# Patient Record
Sex: Male | Born: 1960 | Race: Black or African American | Hispanic: No | Marital: Single | Smoking: Former smoker
Health system: Southern US, Community
[De-identification: ages and names within clinical notes are randomized; demographics above are authoritative.]

## PROBLEM LIST (undated history)

## (undated) DIAGNOSIS — B20 Human immunodeficiency virus [HIV] disease: Secondary | ICD-10-CM

## (undated) HISTORY — DX: Human immunodeficiency virus (HIV) disease: B20

---

## 2005-11-19 ENCOUNTER — Encounter: Admission: RE | Admit: 2005-11-19 | Discharge: 2005-11-19 | Payer: Self-pay | Admitting: Infectious Diseases

## 2005-11-19 ENCOUNTER — Encounter (INDEPENDENT_AMBULATORY_CARE_PROVIDER_SITE_OTHER): Payer: Self-pay | Admitting: *Deleted

## 2005-11-19 ENCOUNTER — Ambulatory Visit: Payer: Self-pay | Admitting: Internal Medicine

## 2005-11-19 LAB — CONVERTED CEMR LAB: CD4 T Cell Abs: 9

## 2005-12-04 ENCOUNTER — Ambulatory Visit: Payer: Self-pay | Admitting: Internal Medicine

## 2005-12-04 ENCOUNTER — Encounter (INDEPENDENT_AMBULATORY_CARE_PROVIDER_SITE_OTHER): Payer: Self-pay | Admitting: *Deleted

## 2005-12-11 ENCOUNTER — Ambulatory Visit (HOSPITAL_COMMUNITY): Admission: RE | Admit: 2005-12-11 | Discharge: 2005-12-11 | Payer: Self-pay | Admitting: Oral Surgery

## 2005-12-17 ENCOUNTER — Ambulatory Visit: Payer: Self-pay | Admitting: Internal Medicine

## 2005-12-25 ENCOUNTER — Ambulatory Visit: Payer: Self-pay | Admitting: Internal Medicine

## 2005-12-31 ENCOUNTER — Ambulatory Visit: Payer: Self-pay | Admitting: Internal Medicine

## 2005-12-31 ENCOUNTER — Ambulatory Visit (HOSPITAL_COMMUNITY): Admission: RE | Admit: 2005-12-31 | Discharge: 2005-12-31 | Payer: Self-pay | Admitting: Internal Medicine

## 2006-01-08 ENCOUNTER — Ambulatory Visit: Payer: Self-pay | Admitting: Internal Medicine

## 2006-01-10 ENCOUNTER — Ambulatory Visit (HOSPITAL_COMMUNITY): Admission: RE | Admit: 2006-01-10 | Discharge: 2006-01-10 | Payer: Self-pay | Admitting: Internal Medicine

## 2006-01-29 ENCOUNTER — Encounter: Admission: RE | Admit: 2006-01-29 | Discharge: 2006-01-29 | Payer: Self-pay | Admitting: Internal Medicine

## 2006-01-29 ENCOUNTER — Ambulatory Visit: Payer: Self-pay | Admitting: Internal Medicine

## 2006-01-29 ENCOUNTER — Encounter (INDEPENDENT_AMBULATORY_CARE_PROVIDER_SITE_OTHER): Payer: Self-pay | Admitting: *Deleted

## 2006-01-29 LAB — CONVERTED CEMR LAB: HIV 1 RNA Quant: 22200 copies/mL

## 2006-02-12 ENCOUNTER — Ambulatory Visit: Payer: Self-pay | Admitting: Internal Medicine

## 2006-03-07 DIAGNOSIS — B370 Candidal stomatitis: Secondary | ICD-10-CM

## 2006-03-07 DIAGNOSIS — K029 Dental caries, unspecified: Secondary | ICD-10-CM | POA: Insufficient documentation

## 2006-03-07 DIAGNOSIS — B20 Human immunodeficiency virus [HIV] disease: Secondary | ICD-10-CM

## 2006-03-07 DIAGNOSIS — F329 Major depressive disorder, single episode, unspecified: Secondary | ICD-10-CM

## 2006-03-07 DIAGNOSIS — B3781 Candidal esophagitis: Secondary | ICD-10-CM

## 2006-04-24 ENCOUNTER — Encounter: Payer: Self-pay | Admitting: Internal Medicine

## 2006-05-15 ENCOUNTER — Encounter (INDEPENDENT_AMBULATORY_CARE_PROVIDER_SITE_OTHER): Payer: Self-pay | Admitting: *Deleted

## 2006-05-15 ENCOUNTER — Encounter: Admission: RE | Admit: 2006-05-15 | Discharge: 2006-05-15 | Payer: Self-pay | Admitting: Internal Medicine

## 2006-05-15 ENCOUNTER — Ambulatory Visit: Payer: Self-pay | Admitting: Internal Medicine

## 2006-05-15 LAB — CONVERTED CEMR LAB: CD4 Count: 120 microliters

## 2006-05-28 ENCOUNTER — Ambulatory Visit: Payer: Self-pay | Admitting: Internal Medicine

## 2006-07-14 ENCOUNTER — Encounter (INDEPENDENT_AMBULATORY_CARE_PROVIDER_SITE_OTHER): Payer: Self-pay | Admitting: *Deleted

## 2006-07-14 LAB — CONVERTED CEMR LAB

## 2006-07-27 ENCOUNTER — Encounter (INDEPENDENT_AMBULATORY_CARE_PROVIDER_SITE_OTHER): Payer: Self-pay | Admitting: *Deleted

## 2006-09-02 ENCOUNTER — Encounter: Admission: RE | Admit: 2006-09-02 | Discharge: 2006-09-02 | Payer: Self-pay | Admitting: Internal Medicine

## 2006-09-02 ENCOUNTER — Ambulatory Visit: Payer: Self-pay | Admitting: Internal Medicine

## 2006-09-02 LAB — CONVERTED CEMR LAB
ALT: 14 units/L (ref 0–53)
Albumin: 4.2 g/dL (ref 3.5–5.2)
BUN: 13 mg/dL (ref 6–23)
Basophils Absolute: 0 10*3/uL (ref 0.0–0.1)
Basophils Relative: 1 % (ref 0–1)
CD4 Count: 200 microliters
Creatinine, Ser: 1.43 mg/dL (ref 0.40–1.50)
Eosinophils Relative: 8 % — ABNORMAL HIGH (ref 0–5)
HIV 1 RNA Quant: 67 copies/mL — ABNORMAL HIGH (ref <50)
HIV-1 RNA Quant, Log: 1.83 — ABNORMAL HIGH (ref <1.70)
MCV: 103.3 fL — ABNORMAL HIGH (ref 78.0–100.0)
Monocytes Absolute: 0.5 10*3/uL (ref 0.2–0.7)
Neutro Abs: 1.3 10*3/uL — ABNORMAL LOW (ref 1.7–7.7)
Neutrophils Relative %: 35 % — ABNORMAL LOW (ref 43–77)
Potassium: 4.7 meq/L (ref 3.5–5.3)
RDW: 13.1 % (ref 11.5–14.0)
Sodium: 137 meq/L (ref 135–145)
Total Protein: 7.5 g/dL (ref 6.0–8.3)

## 2006-09-17 ENCOUNTER — Ambulatory Visit: Payer: Self-pay | Admitting: Internal Medicine

## 2006-10-21 ENCOUNTER — Telehealth: Payer: Self-pay | Admitting: Internal Medicine

## 2006-11-17 ENCOUNTER — Encounter: Admission: RE | Admit: 2006-11-17 | Discharge: 2006-11-17 | Payer: Self-pay | Admitting: Internal Medicine

## 2006-11-17 ENCOUNTER — Ambulatory Visit: Payer: Self-pay | Admitting: Internal Medicine

## 2006-11-17 LAB — CONVERTED CEMR LAB
ALT: 10 units/L (ref 0–53)
Albumin: 4.2 g/dL (ref 3.5–5.2)
Basophils Absolute: 0.1 10*3/uL (ref 0.0–0.1)
CO2: 22 meq/L (ref 19–32)
Calcium: 10 mg/dL (ref 8.4–10.5)
Creatinine, Ser: 1.76 mg/dL — ABNORMAL HIGH (ref 0.40–1.50)
Eosinophils Absolute: 0.4 10*3/uL (ref 0.0–0.7)
Lymphs Abs: 1.4 10*3/uL (ref 0.7–3.3)
MCHC: 33 g/dL (ref 30.0–36.0)
MCV: 104.4 fL — ABNORMAL HIGH (ref 78.0–100.0)
Monocytes Absolute: 0.4 10*3/uL (ref 0.2–0.7)
Platelets: 151 10*3/uL (ref 150–400)
Potassium: 4.2 meq/L (ref 3.5–5.3)
Sodium: 137 meq/L (ref 135–145)
Total Protein: 7.3 g/dL (ref 6.0–8.3)

## 2006-11-18 ENCOUNTER — Encounter: Payer: Self-pay | Admitting: Internal Medicine

## 2006-11-20 ENCOUNTER — Ambulatory Visit: Payer: Self-pay | Admitting: Internal Medicine

## 2006-11-20 LAB — CONVERTED CEMR LAB
HIV 1 RNA Quant: 117 copies/mL — ABNORMAL HIGH (ref <50)
HIV-1 RNA Quant, Log: 2.07 — ABNORMAL HIGH (ref <1.70)

## 2006-11-24 ENCOUNTER — Telehealth: Payer: Self-pay | Admitting: Internal Medicine

## 2006-12-02 ENCOUNTER — Encounter (INDEPENDENT_AMBULATORY_CARE_PROVIDER_SITE_OTHER): Payer: Self-pay | Admitting: *Deleted

## 2006-12-05 ENCOUNTER — Ambulatory Visit: Payer: Self-pay | Admitting: Internal Medicine

## 2006-12-05 DIAGNOSIS — K219 Gastro-esophageal reflux disease without esophagitis: Secondary | ICD-10-CM | POA: Insufficient documentation

## 2007-02-23 ENCOUNTER — Ambulatory Visit: Payer: Self-pay | Admitting: Internal Medicine

## 2007-02-23 ENCOUNTER — Encounter: Admission: RE | Admit: 2007-02-23 | Discharge: 2007-02-23 | Payer: Self-pay | Admitting: Internal Medicine

## 2007-02-23 LAB — CONVERTED CEMR LAB
ALT: 10 units/L (ref 0–53)
Albumin: 4.2 g/dL (ref 3.5–5.2)
Alkaline Phosphatase: 161 units/L — ABNORMAL HIGH (ref 39–117)
Basophils Absolute: 0 10*3/uL (ref 0.0–0.1)
CO2: 23 meq/L (ref 19–32)
Calcium: 9.9 mg/dL (ref 8.4–10.5)
Chloride: 104 meq/L (ref 96–112)
Eosinophils Absolute: 0.3 10*3/uL (ref 0.0–0.7)
Eosinophils Relative: 6 % — ABNORMAL HIGH (ref 0–5)
HIV 1 RNA Quant: 79 copies/mL — ABNORMAL HIGH (ref <50)
Lymphocytes Relative: 36 % (ref 12–46)
MCHC: 33.4 g/dL (ref 30.0–36.0)
MCV: 103.2 fL — ABNORMAL HIGH (ref 78.0–100.0)
Monocytes Absolute: 0.5 10*3/uL (ref 0.2–0.7)
Monocytes Relative: 11 % (ref 3–11)
Platelets: 139 10*3/uL — ABNORMAL LOW (ref 150–400)
Sodium: 137 meq/L (ref 135–145)
Total Bilirubin: 2 mg/dL — ABNORMAL HIGH (ref 0.3–1.2)
Total Protein: 7.8 g/dL (ref 6.0–8.3)
WBC: 4.3 10*3/uL (ref 4.0–10.5)

## 2007-03-11 ENCOUNTER — Ambulatory Visit: Payer: Self-pay | Admitting: Internal Medicine

## 2007-03-12 ENCOUNTER — Encounter: Payer: Self-pay | Admitting: Internal Medicine

## 2007-06-12 ENCOUNTER — Ambulatory Visit: Payer: Self-pay | Admitting: Internal Medicine

## 2007-06-12 ENCOUNTER — Encounter: Admission: RE | Admit: 2007-06-12 | Discharge: 2007-06-12 | Payer: Self-pay | Admitting: Internal Medicine

## 2007-06-12 LAB — CONVERTED CEMR LAB
AST: 21 units/L (ref 0–37)
Albumin: 4.3 g/dL (ref 3.5–5.2)
Alkaline Phosphatase: 148 units/L — ABNORMAL HIGH (ref 39–117)
BUN: 11 mg/dL (ref 6–23)
Calcium: 9.5 mg/dL (ref 8.4–10.5)
Eosinophils Absolute: 0.3 10*3/uL (ref 0.0–0.7)
Eosinophils Relative: 6 % — ABNORMAL HIGH (ref 0–5)
Glucose, Bld: 111 mg/dL — ABNORMAL HIGH (ref 70–99)
HIV-1 RNA Quant, Log: <1.7 (ref <1.70)
MCHC: 33.8 g/dL (ref 30.0–36.0)
Monocytes Relative: 10 % (ref 3–12)
Neutrophils Relative %: 43 % (ref 43–77)
Platelets: 141 10*3/uL — ABNORMAL LOW (ref 150–400)
RBC: 4.91 M/uL (ref 4.22–5.81)
RDW: 14.2 % (ref 11.5–15.5)
Sodium: 141 meq/L (ref 135–145)
Total Bilirubin: 2.4 mg/dL — ABNORMAL HIGH (ref 0.3–1.2)

## 2007-06-24 ENCOUNTER — Ambulatory Visit: Payer: Self-pay | Admitting: Internal Medicine

## 2007-08-19 IMAGING — CT CT MAXILLOFACIAL W/ CM
1 of 2 series · 15 of 30 positions shown, 19 images · IV contrast (100 ML OMNI 300)
Comparison: none

CLINICAL DATA: 44-year-old male.  Evaluate sinuses for fungus disease.  History of surgery.
MAXILLOFACIAL CT WITH CONTRAST:
TECHNIQUE: Axial and coronal plane CT imaging was performed through the maxillofacial structures following intravenous contrast administration.
Contrast:  100 cc Omnipaque 300.

[Series 106: sinus prone · axial · 0.43mm/px · z∈[+1,+141]mm · 15 of 128 slices shown, 19 images]
[im 8/128  brain]
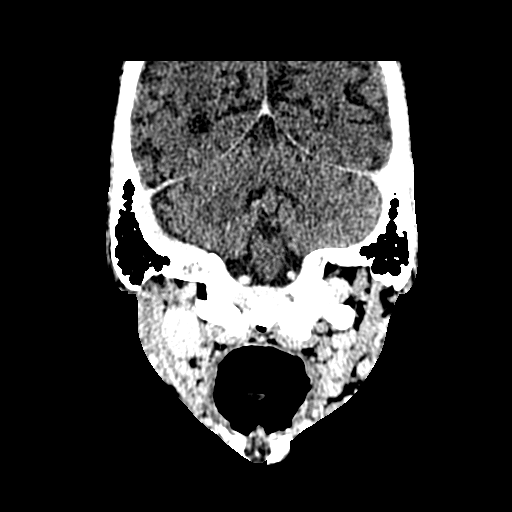
[im 8/128  bone]
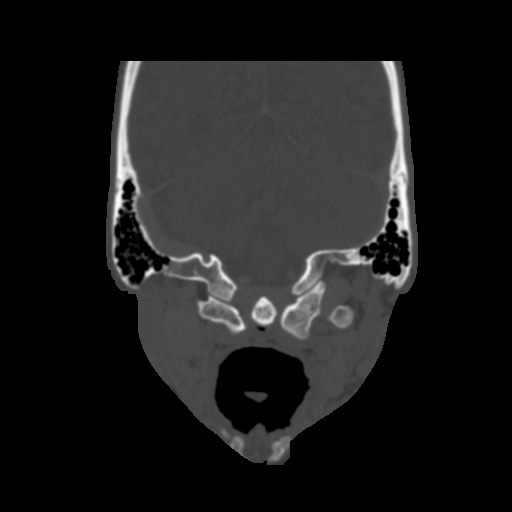
[im 15/128  bone]
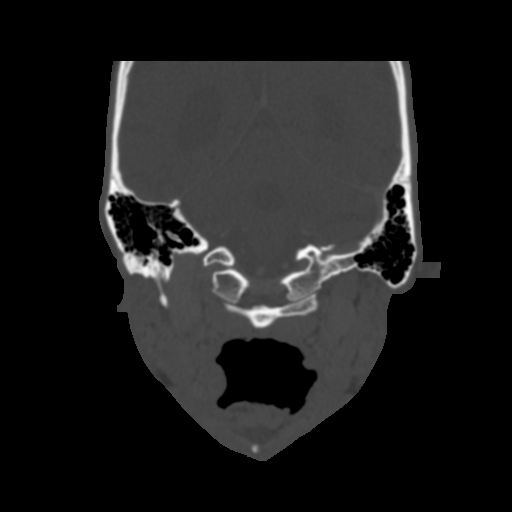
[im 22/128  bone]
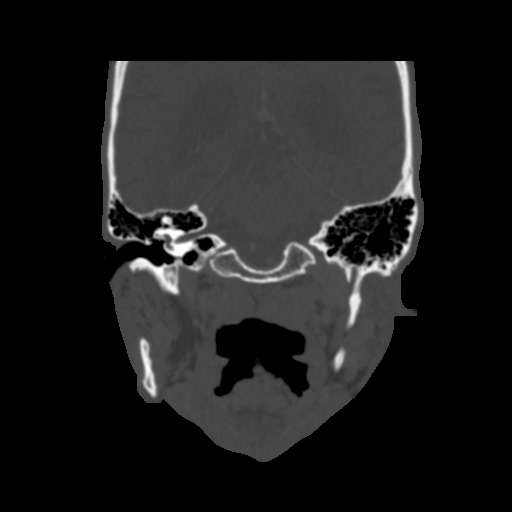
[im 29/128  bone]
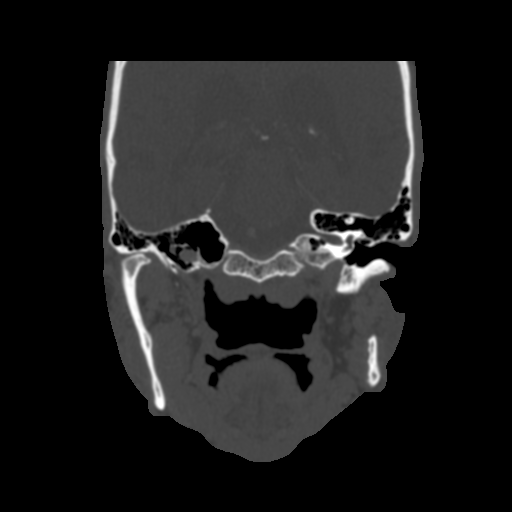
[im 43/128  brain]
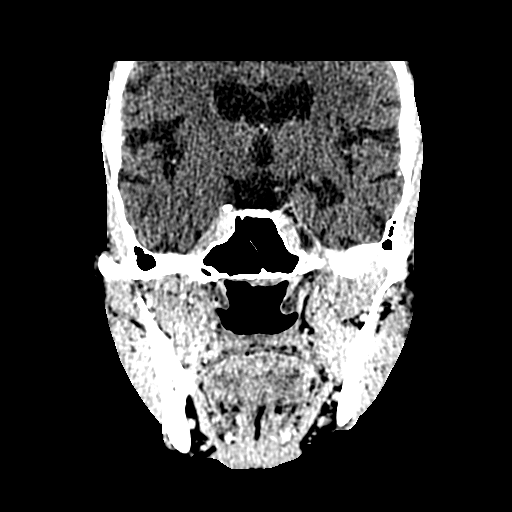
[im 43/128  bone]
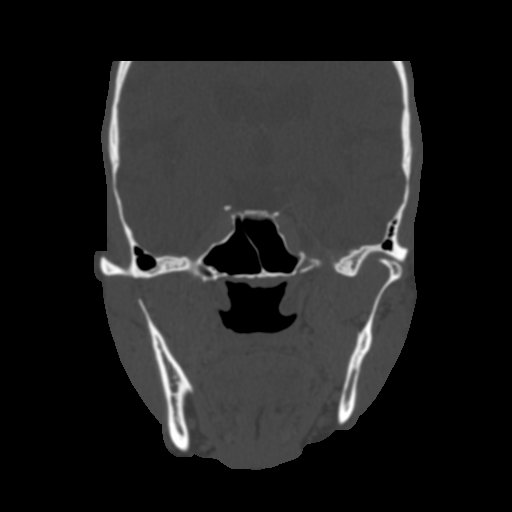
[im 50/128  bone]
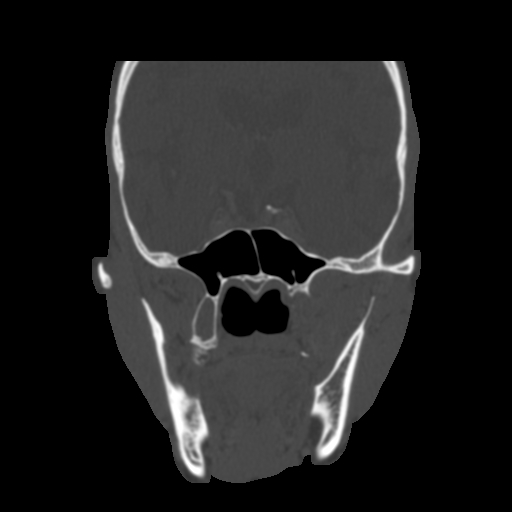
[im 57/128  bone]
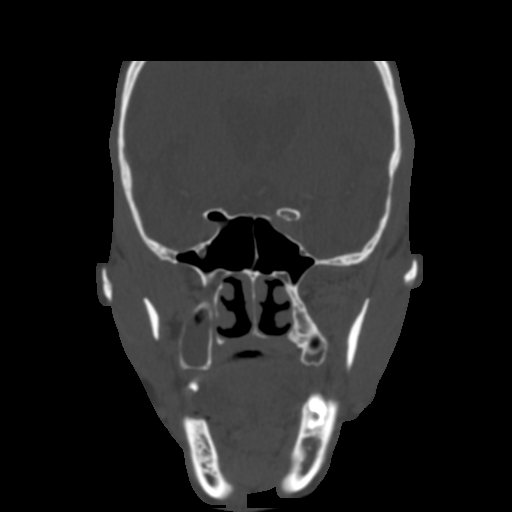
[im 64/128  bone]
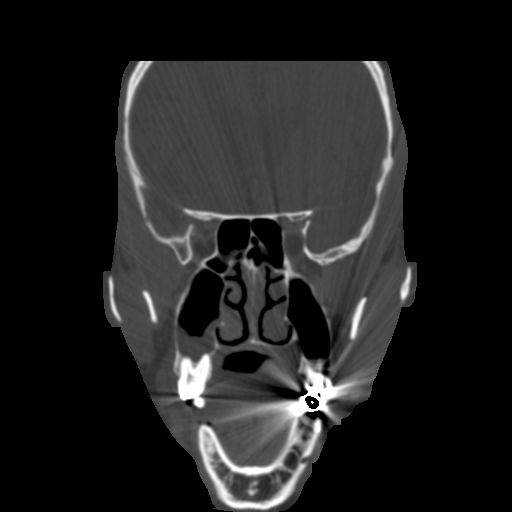
[im 71/128  brain]
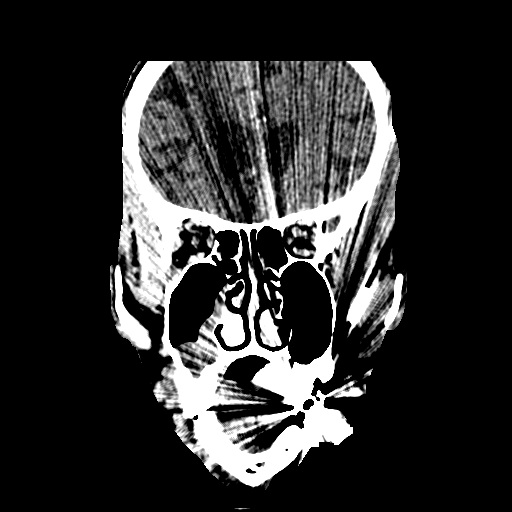
[im 71/128  bone]
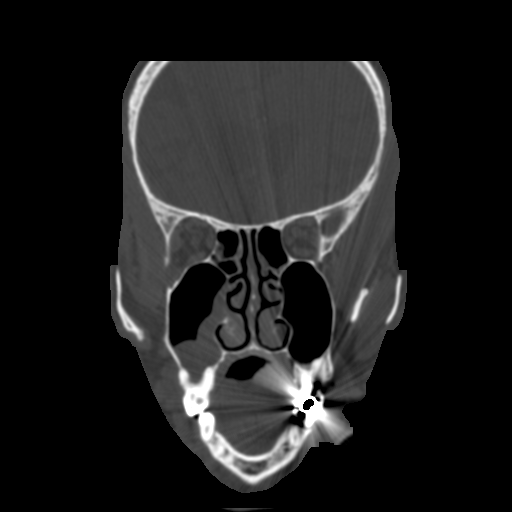
[im 78/128  bone]
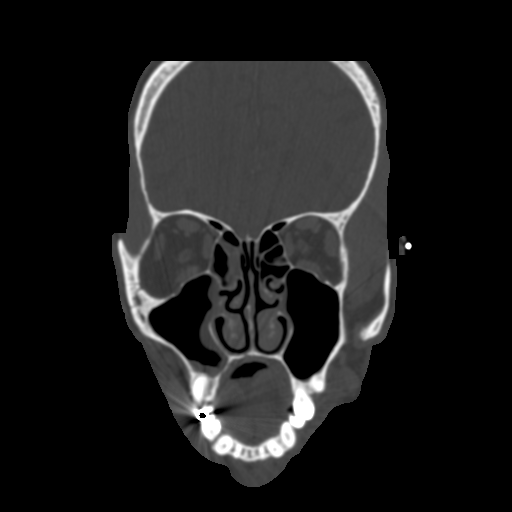
[im 85/128  bone]
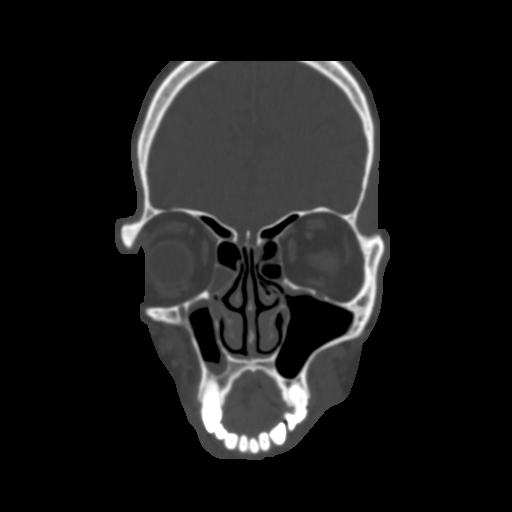
[im 99/128  bone]
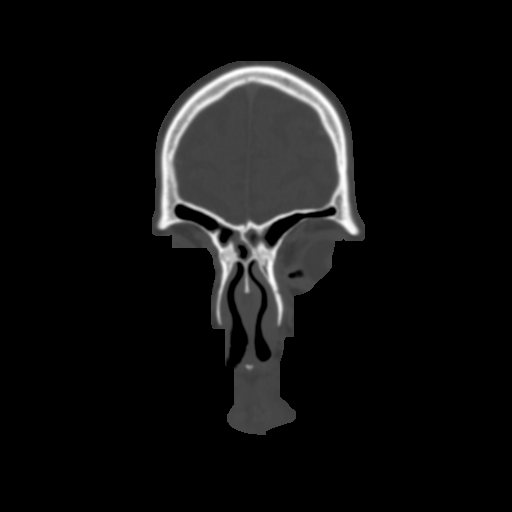
[im 106/128  brain]
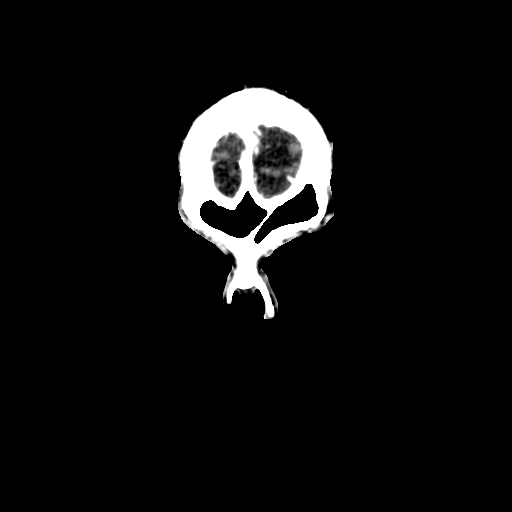
[im 106/128  bone]
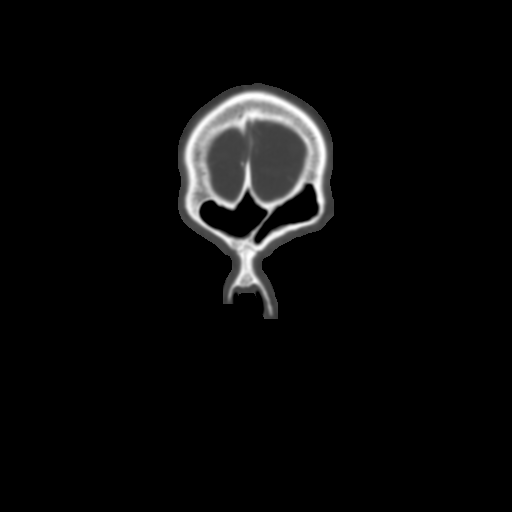
[im 113/128  bone]
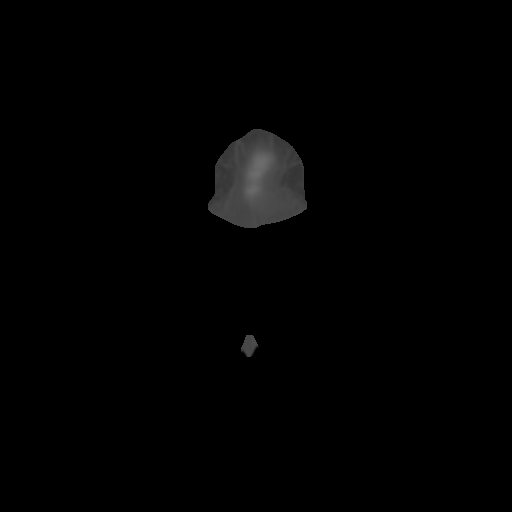
[im 120/128  bone]
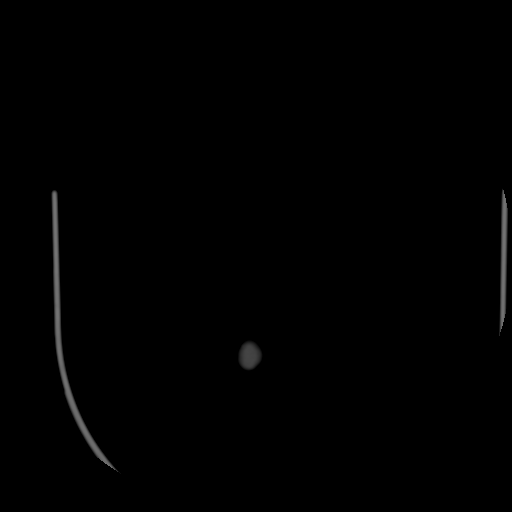

[15 of 30 positions shown; findings below may reference images not displayed]

FINDINGS: There is mild mucosal thickening within the left maxillary sinus.  It extends to the alveolar ridge.  There is some mucosal thickening at the ostium of the infundibulum.  There is scattered opacification of left greater than right ethmoid air cells.  Mild mucosal thickening is present in the inferior aspect of the frontal sinuses bilaterally.  Posterior ethmoid air cells are clear.  The sphenoid sinuses are clear.  The mastoid air cells are also clear.  There is minimal fullness in the right tonsillar fossa. This may be due to patient positioning.  The area should be amenable to direct visualization.  
Limited imaging of the brain demonstrates age advanced atrophy.  No focal lesions are identified.
IMPRESSION: 1. Mild mucosal thickening in the left maxillary sinus extending to alveolar ridge.  
2. Bilateral, left worst than right, scattered anterior ethmoid opacification and minimal mucosal thickening within the inferior frontal sinuses.  
3. No osseous destruction to suggest aggressive sinusitis or fungal sinusitis.
4. Age advanced atrophy.

## 2007-09-23 ENCOUNTER — Encounter: Admission: RE | Admit: 2007-09-23 | Discharge: 2007-09-23 | Payer: Self-pay | Admitting: Internal Medicine

## 2007-09-23 ENCOUNTER — Ambulatory Visit: Payer: Self-pay | Admitting: Internal Medicine

## 2007-09-23 LAB — CONVERTED CEMR LAB
ALT: 30 units/L (ref 0–53)
Albumin: 4.1 g/dL (ref 3.5–5.2)
Basophils Absolute: 0.1 10*3/uL (ref 0.0–0.1)
Eosinophils Relative: 3 % (ref 0–5)
HIV 1 RNA Quant: 132 copies/mL — ABNORMAL HIGH (ref <50)
Hemoglobin: 17 g/dL (ref 13.0–17.0)
Lymphs Abs: 1.7 10*3/uL (ref 0.7–4.0)
MCHC: 34.3 g/dL (ref 30.0–36.0)
MCV: 99.2 fL (ref 78.0–100.0)
Neutrophils Relative %: 62 % (ref 43–77)
Platelets: 181 10*3/uL (ref 150–400)
RBC: 5 M/uL (ref 4.22–5.81)
RDW: 13.5 % (ref 11.5–15.5)
Total Bilirubin: 2.7 mg/dL — ABNORMAL HIGH (ref 0.3–1.2)
WBC: 6.9 10*3/uL (ref 4.0–10.5)

## 2007-10-07 ENCOUNTER — Ambulatory Visit: Payer: Self-pay | Admitting: Internal Medicine

## 2008-01-13 ENCOUNTER — Encounter: Payer: Self-pay | Admitting: Internal Medicine

## 2008-01-20 ENCOUNTER — Ambulatory Visit: Payer: Self-pay | Admitting: Internal Medicine

## 2008-01-20 LAB — CONVERTED CEMR LAB
BUN: 11 mg/dL (ref 6–23)
Basophils Absolute: 0 10*3/uL (ref 0.0–0.1)
CO2: 23 meq/L (ref 19–32)
Chloride: 103 meq/L (ref 96–112)
HCT: 50.2 % (ref 39.0–52.0)
HIV 1 RNA Quant: 96 copies/mL — ABNORMAL HIGH (ref <50)
HIV-1 RNA Quant, Log: 1.98 — ABNORMAL HIGH (ref <1.70)
Lymphocytes Relative: 32 % (ref 12–46)
Lymphs Abs: 2.3 10*3/uL (ref 0.7–4.0)
MCHC: 34.5 g/dL (ref 30.0–36.0)
Monocytes Absolute: 0.5 10*3/uL (ref 0.1–1.0)
Neutro Abs: 4.1 10*3/uL (ref 1.7–7.7)
Neutrophils Relative %: 57 % (ref 43–77)
Platelets: 141 10*3/uL — ABNORMAL LOW (ref 150–400)
RBC: 5.02 M/uL (ref 4.22–5.81)
Sodium: 138 meq/L (ref 135–145)

## 2008-02-10 ENCOUNTER — Ambulatory Visit: Payer: Self-pay | Admitting: Internal Medicine

## 2008-04-13 ENCOUNTER — Encounter (INDEPENDENT_AMBULATORY_CARE_PROVIDER_SITE_OTHER): Payer: Self-pay | Admitting: *Deleted

## 2008-05-16 ENCOUNTER — Ambulatory Visit: Payer: Self-pay | Admitting: Infectious Diseases

## 2008-05-16 ENCOUNTER — Encounter: Payer: Self-pay | Admitting: Internal Medicine

## 2008-05-16 LAB — CONVERTED CEMR LAB
AST: 24 units/L (ref 0–37)
Alkaline Phosphatase: 155 units/L — ABNORMAL HIGH (ref 39–117)
CO2: 22 meq/L (ref 19–32)
Creatinine, Ser: 1.42 mg/dL (ref 0.40–1.50)
Glucose, Bld: 155 mg/dL — ABNORMAL HIGH (ref 70–99)
HIV 1 RNA Quant: 471 copies/mL — ABNORMAL HIGH (ref <48)
Hemoglobin: 17.2 g/dL — ABNORMAL HIGH (ref 13.0–17.0)
Lymphocytes Relative: 32 % (ref 12–46)
MCHC: 33.7 g/dL (ref 30.0–36.0)
MCV: 100.8 fL — ABNORMAL HIGH (ref 78.0–100.0)
Monocytes Relative: 8 % (ref 3–12)
Neutro Abs: 3.2 10*3/uL (ref 1.7–7.7)
Neutrophils Relative %: 57 % (ref 43–77)
Platelets: 150 10*3/uL (ref 150–400)
Potassium: 4.3 meq/L (ref 3.5–5.3)
RDW: 13.6 % (ref 11.5–15.5)
Sodium: 140 meq/L (ref 135–145)
Total Bilirubin: 2.5 mg/dL — ABNORMAL HIGH (ref 0.3–1.2)

## 2008-05-25 ENCOUNTER — Ambulatory Visit: Payer: Self-pay | Admitting: Internal Medicine

## 2008-05-25 DIAGNOSIS — R7309 Other abnormal glucose: Secondary | ICD-10-CM

## 2008-07-27 ENCOUNTER — Telehealth (INDEPENDENT_AMBULATORY_CARE_PROVIDER_SITE_OTHER): Payer: Self-pay | Admitting: *Deleted

## 2008-07-27 ENCOUNTER — Ambulatory Visit: Payer: Self-pay | Admitting: Internal Medicine

## 2008-07-27 ENCOUNTER — Ambulatory Visit (HOSPITAL_COMMUNITY): Admission: RE | Admit: 2008-07-27 | Discharge: 2008-07-27 | Payer: Self-pay | Admitting: Internal Medicine

## 2008-07-27 DIAGNOSIS — R05 Cough: Secondary | ICD-10-CM

## 2008-07-27 DIAGNOSIS — R059 Cough, unspecified: Secondary | ICD-10-CM | POA: Insufficient documentation

## 2008-08-22 ENCOUNTER — Encounter: Payer: Self-pay | Admitting: Internal Medicine

## 2008-09-23 ENCOUNTER — Encounter: Payer: Self-pay | Admitting: Internal Medicine

## 2008-09-23 ENCOUNTER — Ambulatory Visit: Payer: Self-pay | Admitting: Infectious Disease

## 2008-09-23 LAB — CONVERTED CEMR LAB
ALT: 60 units/L — ABNORMAL HIGH (ref 0–53)
Albumin: 4.3 g/dL (ref 3.5–5.2)
Alkaline Phosphatase: 147 units/L — ABNORMAL HIGH (ref 39–117)
BUN: 10 mg/dL (ref 6–23)
Basophils Absolute: 0 10*3/uL (ref 0.0–0.1)
Chloride: 106 meq/L (ref 96–112)
Cholesterol: 165 mg/dL (ref 0–200)
HCT: 48.6 % (ref 39.0–52.0)
HDL: 33 mg/dL — ABNORMAL LOW (ref >39)
HIV-1 RNA Quant, Log: <1.68 (ref <1.68)
LDL Cholesterol: 90 mg/dL (ref 0–99)
Lymphocytes Relative: 32 % (ref 12–46)
Lymphs Abs: 2.1 10*3/uL (ref 0.7–4.0)
Monocytes Relative: 8 % (ref 3–12)
Neutrophils Relative %: 56 % (ref 43–77)
Potassium: 4.2 meq/L (ref 3.5–5.3)
RBC: 4.81 M/uL (ref 4.22–5.81)
RDW: 13.9 % (ref 11.5–15.5)
Total Bilirubin: 3.6 mg/dL — ABNORMAL HIGH (ref 0.3–1.2)
Total Protein: 7.5 g/dL (ref 6.0–8.3)
Triglycerides: 212 mg/dL — ABNORMAL HIGH (ref <150)
WBC: 6.4 10*3/uL (ref 4.0–10.5)

## 2008-10-07 ENCOUNTER — Ambulatory Visit: Payer: Self-pay | Admitting: Internal Medicine

## 2009-01-05 ENCOUNTER — Ambulatory Visit: Payer: Self-pay | Admitting: Internal Medicine

## 2009-01-05 LAB — CONVERTED CEMR LAB
ALT: 31 units/L (ref 0–53)
AST: 23 units/L (ref 0–37)
Alkaline Phosphatase: 140 units/L — ABNORMAL HIGH (ref 39–117)
BUN: 6 mg/dL (ref 6–23)
Chloride: 106 meq/L (ref 96–112)
Creatinine, Ser: 1.49 mg/dL (ref 0.40–1.50)
Eosinophils Absolute: 0.2 10*3/uL (ref 0.0–0.7)
Glucose, Bld: 134 mg/dL — ABNORMAL HIGH (ref 70–99)
HCT: 48.9 % (ref 39.0–52.0)
HIV 1 RNA Quant: 50 copies/mL — ABNORMAL HIGH (ref <48)
Hemoglobin: 16.5 g/dL (ref 13.0–17.0)
Lymphocytes Relative: 33 % (ref 12–46)
Lymphs Abs: 2.1 10*3/uL (ref 0.7–4.0)
Monocytes Relative: 7 % (ref 3–12)
Neutro Abs: 3.4 10*3/uL (ref 1.7–7.7)
Potassium: 3.9 meq/L (ref 3.5–5.3)
RBC: 4.79 M/uL (ref 4.22–5.81)
RDW: 14.3 % (ref 11.5–15.5)
Total Bilirubin: 3.3 mg/dL — ABNORMAL HIGH (ref 0.3–1.2)

## 2009-01-20 ENCOUNTER — Ambulatory Visit: Payer: Self-pay | Admitting: Internal Medicine

## 2009-01-20 DIAGNOSIS — I951 Orthostatic hypotension: Secondary | ICD-10-CM

## 2009-04-07 ENCOUNTER — Ambulatory Visit: Payer: Self-pay | Admitting: Infectious Disease

## 2009-04-07 LAB — CONVERTED CEMR LAB
ALT: 20 units/L (ref 0–53)
Alkaline Phosphatase: 137 units/L — ABNORMAL HIGH (ref 39–117)
Basophils Absolute: 0 10*3/uL (ref 0.0–0.1)
Basophils Relative: 1 % (ref 0–1)
Chloride: 103 meq/L (ref 96–112)
Eosinophils Absolute: 0.2 10*3/uL (ref 0.0–0.7)
HCT: 49.8 % (ref 39.0–52.0)
HIV 1 RNA Quant: <48 copies/mL (ref <48)
Lymphocytes Relative: 26 % (ref 12–46)
MCHC: 33.3 g/dL (ref 30.0–36.0)
MCV: 103.5 fL — ABNORMAL HIGH (ref >=78.0)
Monocytes Absolute: 0.6 10*3/uL (ref 0.1–1.0)
Monocytes Relative: 10 % (ref 3–12)
Neutro Abs: 3.9 10*3/uL (ref 1.7–7.7)
RBC: 4.81 M/uL (ref 4.22–5.81)
RDW: 13.3 % (ref 11.5–15.5)
Total Bilirubin: 5.5 mg/dL — ABNORMAL HIGH (ref 0.3–1.2)
WBC: 6.3 10*3/uL (ref 4.0–10.5)

## 2009-04-26 ENCOUNTER — Ambulatory Visit: Payer: Self-pay | Admitting: Internal Medicine

## 2009-07-05 ENCOUNTER — Ambulatory Visit: Payer: Self-pay | Admitting: Internal Medicine

## 2009-07-05 DIAGNOSIS — R197 Diarrhea, unspecified: Secondary | ICD-10-CM

## 2009-07-06 LAB — CONVERTED CEMR LAB
ALT: 20 units/L (ref 0–53)
AST: 18 units/L (ref 0–37)
Amylase: 49 units/L (ref 0–105)
Calcium: 9.7 mg/dL (ref 8.4–10.5)
Creatinine, Ser: 1.7 mg/dL — ABNORMAL HIGH (ref 0.40–1.50)
Lipase: 38 units/L (ref 0–75)
Total Bilirubin: 4.6 mg/dL — ABNORMAL HIGH (ref 0.3–1.2)
Total Protein: 7.2 g/dL (ref 6.0–8.3)

## 2009-07-26 ENCOUNTER — Ambulatory Visit: Payer: Self-pay | Admitting: Internal Medicine

## 2009-07-26 LAB — CONVERTED CEMR LAB
ALT: 14 units/L (ref 0–53)
Albumin: 4.5 g/dL (ref 3.5–5.2)
Alkaline Phosphatase: 130 units/L — ABNORMAL HIGH (ref 39–117)
Basophils Relative: 1 % (ref 0–1)
CO2: 23 meq/L (ref 19–32)
Calcium: 9.2 mg/dL (ref 8.4–10.5)
Chloride: 103 meq/L (ref 96–112)
Glucose, Bld: 125 mg/dL — ABNORMAL HIGH (ref 70–99)
HCT: 46.6 % (ref 39.0–52.0)
HIV-1 RNA Quant, Log: 2.48 — ABNORMAL HIGH (ref <1.68)
Hemoglobin: 15.8 g/dL (ref 13.0–17.0)
Lymphocytes Relative: 29 % (ref 12–46)
Lymphs Abs: 1.6 10*3/uL (ref 0.7–4.0)
MCHC: 33.9 g/dL (ref 30.0–36.0)
MCV: 103.1 fL — ABNORMAL HIGH (ref >=78.0)
Monocytes Absolute: 0.3 10*3/uL (ref 0.1–1.0)
Neutro Abs: 3.5 10*3/uL (ref 1.7–7.7)
Platelets: 127 10*3/uL — ABNORMAL LOW (ref 150–400)
RDW: 13.7 % (ref 11.5–15.5)
Sodium: 140 meq/L (ref 135–145)
Total Bilirubin: 3.9 mg/dL — ABNORMAL HIGH (ref 0.3–1.2)
Total Protein: 7.3 g/dL (ref 6.0–8.3)
WBC: 5.5 10*3/uL (ref 4.0–10.5)

## 2009-08-09 ENCOUNTER — Ambulatory Visit: Payer: Self-pay | Admitting: Internal Medicine

## 2009-09-04 ENCOUNTER — Ambulatory Visit: Payer: Self-pay | Admitting: Internal Medicine

## 2009-09-04 LAB — CONVERTED CEMR LAB
AST: 11 units/L (ref 0–37)
Alkaline Phosphatase: 150 units/L — ABNORMAL HIGH (ref 39–117)
Basophils Absolute: 0 10*3/uL (ref 0.0–0.1)
CO2: 23 meq/L (ref 19–32)
Calcium: 9.2 mg/dL (ref 8.4–10.5)
Eosinophils Absolute: 0.2 10*3/uL (ref 0.0–0.7)
Eosinophils Relative: 3 % (ref 0–5)
Glucose, Bld: 148 mg/dL — ABNORMAL HIGH (ref 70–99)
HIV 1 RNA Quant: <48 copies/mL (ref <48)
Lymphocytes Relative: 31 % (ref 12–46)
Lymphs Abs: 1.7 10*3/uL (ref 0.7–4.0)
Sodium: 137 meq/L (ref 135–145)
Total Protein: 7.2 g/dL (ref 6.0–8.3)

## 2009-09-28 ENCOUNTER — Ambulatory Visit: Payer: Self-pay | Admitting: Internal Medicine

## 2009-09-28 DIAGNOSIS — H00029 Hordeolum internum unspecified eye, unspecified eyelid: Secondary | ICD-10-CM | POA: Insufficient documentation

## 2009-10-25 ENCOUNTER — Ambulatory Visit: Payer: Self-pay | Admitting: Internal Medicine

## 2009-12-11 ENCOUNTER — Ambulatory Visit: Payer: Self-pay | Admitting: Internal Medicine

## 2009-12-11 LAB — CONVERTED CEMR LAB
Albumin: 4.3 g/dL (ref 3.5–5.2)
Basophils Absolute: 0 10*3/uL (ref 0.0–0.1)
Basophils Relative: 1 % (ref 0–1)
CO2: 24 meq/L (ref 19–32)
Chloride: 107 meq/L (ref 96–112)
Eosinophils Absolute: 0.2 10*3/uL (ref 0.0–0.7)
HDL: 37 mg/dL — ABNORMAL LOW (ref >39)
HIV-1 RNA Quant, Log: <1.68 (ref <1.68)
Lymphocytes Relative: 38 % (ref 12–46)
Lymphs Abs: 2.3 10*3/uL (ref 0.7–4.0)
MCV: 105.6 fL — ABNORMAL HIGH (ref 78.0–100.0)
Neutro Abs: 3.1 10*3/uL (ref 1.7–7.7)
Neutrophils Relative %: 51 % (ref 43–77)
Platelets: 127 10*3/uL — ABNORMAL LOW (ref 150–400)
Potassium: 4.2 meq/L (ref 3.5–5.3)
RBC: 4.61 M/uL (ref 4.22–5.81)
Total Bilirubin: 3.6 mg/dL — ABNORMAL HIGH (ref 0.3–1.2)
Total CHOL/HDL Ratio: 5.3
WBC: 6.1 10*3/uL (ref 4.0–10.5)

## 2009-12-27 ENCOUNTER — Ambulatory Visit: Payer: Self-pay | Admitting: Internal Medicine

## 2009-12-27 DIAGNOSIS — F17200 Nicotine dependence, unspecified, uncomplicated: Secondary | ICD-10-CM

## 2010-02-16 ENCOUNTER — Ambulatory Visit: Payer: Self-pay | Admitting: Internal Medicine

## 2010-02-16 DIAGNOSIS — J209 Acute bronchitis, unspecified: Secondary | ICD-10-CM

## 2010-03-27 ENCOUNTER — Ambulatory Visit: Payer: Self-pay | Admitting: Internal Medicine

## 2010-03-27 LAB — CONVERTED CEMR LAB
AST: 21 units/L (ref 0–37)
Albumin: 4.3 g/dL (ref 3.5–5.2)
Alkaline Phosphatase: 167 units/L — ABNORMAL HIGH (ref 39–117)
BUN: 7 mg/dL (ref 6–23)
Basophils Relative: 1 % (ref 0–1)
CO2: 23 meq/L (ref 19–32)
Calcium: 9.3 mg/dL (ref 8.4–10.5)
Chloride: 106 meq/L (ref 96–112)
Creatinine, Ser: 1.46 mg/dL (ref 0.40–1.50)
HIV 1 RNA Quant: <20 copies/mL (ref <20)
Hemoglobin: 16.6 g/dL (ref 13.0–17.0)
MCHC: 33.1 g/dL (ref 30.0–36.0)
MCV: 106.6 fL — ABNORMAL HIGH (ref 78.0–100.0)
Monocytes Absolute: 0.3 10*3/uL (ref 0.1–1.0)
Neutro Abs: 2.4 10*3/uL (ref 1.7–7.7)
Neutrophils Relative %: 57 % (ref 43–77)
Platelets: 124 10*3/uL — ABNORMAL LOW (ref 150–400)
RBC: 4.7 M/uL (ref 4.22–5.81)
RDW: 14.1 % (ref 11.5–15.5)
Sodium: 140 meq/L (ref 135–145)
Total Bilirubin: 2.8 mg/dL — ABNORMAL HIGH (ref 0.3–1.2)
WBC: 4.3 10*3/uL (ref 4.0–10.5)

## 2010-04-11 ENCOUNTER — Ambulatory Visit: Payer: Self-pay | Admitting: Internal Medicine

## 2010-06-19 NOTE — Assessment & Plan Note (Signed)
Summary: LABS/VS   Chief Complaint:  rf on gabapentin.  History of Present Illness: Pt doing well taking his meds regularly.    Updated Prior Medication List: REYATAZ 300 MG CAPS (ATAZANAVIR SULFATE) take one capsule dailly NORVIR 100 MG CAPS (RITONAVIR)  TRUVADA 200-300 MG TABS (EMTRICITABINE-TENOFOVIR) Take 1 tablet by mouth once a day SEROQUEL 25 MG TABS (QUETIAPINE FUMARATE)  NEURONTIN 300 MG CAPS (GABAPENTIN) three times a day WELLBUTRIN XL 150 MG TB24 (BUPROPION HCL) Take 1 tablet by mouth once a day  Current Allergies: No known allergies   Past Medical History:    Reviewed history from 03/07/2006 and no changes required:       Depression       HIV disease    Risk Factors: Tobacco use:  quit    Year quit:  2004    Pack-years:  1/2 ppd Caffeine use:  1 drinks per day Alcohol use:  yes    Type:  vodka    Drinks per day:  <1 Exercise:  yes    Times per week:  3    Type:  [employer] membership Seatbelt use:  100 %   Review of Systems  The patient denies anorexia, fever, and weight loss.     Vital Signs:  Patient Profile:   50 Years Old Male Height:     75 inches (190.50 cm) Weight:      226.25 pounds (102.84 kg) BMI:     28.38 Temp:     97.2 degrees F (36.22 degrees C) oral Pulse rate:   90 / minute BP sitting:   108 / 72  (left arm)  Pt. in pain?   no  Vitals Entered By: Starleen Arms CMA (February 10, 2008 10:39 AM)              Is Patient Diabetic? No Nutritional Status BMI of 25 - 29 = overweight Nutritional Status Detail nl  Have you ever been in a relationship where you felt threatened, hurt or afraid?No   Does patient need assistance? Functional Status Self care Ambulation Normal     Physical Exam  General:     alert, well-developed, well-nourished, and well-hydrated.   Head:     normocephalic and atraumatic.   Mouth:     pharynx pink and moist.  no thrush  Lungs:     normal breath sounds.      Impression &  Recommendations:  Problem # 1:  HIV DISEASE (ICD-042) Pt.s most recent CD4ct was  340 and VL 96 .  Pt instructed to continue the current antiretroviral regimen.  Pt encouraged to take medication regularly and not miss doses.  Pt will f/u in 3 months for repeat blood work and will see me 2 weeks later.  He was given an influenza vaccine today.  His updated medication list for this problem includes:    Reyataz 300 Mg Caps (Atazanavir sulfate) .Marland Kitchen... Take one capsule dailly    Norvir 100 Mg Caps (Ritonavir)    Truvada 200-300 Mg Tabs (Emtricitabine-tenofovir) .Marland Kitchen... Take 1 tablet by mouth once a day  Diagnostics Reviewed:  CD4: 340 (01/21/2008)   Hgb: 17.3 (01/20/2008)   HCT: 50.2 (01/20/2008)   HIV-1 RNA: 96 (01/20/2008)   HBSAg: NO (07/14/2006)   Other Orders: Est. Patient Level III (16109)  Future Orders: T-CD4 ([id number]) ... 05/10/2008 T-HIV Viral Load [phone]) ... 05/10/2008 T-Comprehensive Metabolic Panel [phone]) ... 05/10/2008 T-CBC w/Diff ([id number]) ... 05/10/2008     Patient Instructions: 1)  Please schedule a  follow-up appointment in 3 months, 2 weeks after labs.   Prescriptions: NEURONTIN 300 MG CAPS (GABAPENTIN) three times a day  #90 x 5   Entered and Authorized by:   Yisroel Ramming MD   Signed by:   Yisroel Ramming MD on 02/10/2008   Method used:   Print then Give to Patient   RxID:   [id number]  ]

## 2010-06-19 NOTE — Assessment & Plan Note (Signed)
Summary: 3 MONTH F/U [MKJ]   CC:  follow-up visit and lab results.  History of Present Illness: patient is here for follow-up of his lab results.  Patient states that he's been taking his medication every day.  He has no new complaints.  Preventive Screening-Counseling & Management  Alcohol-Tobacco     Alcohol drinks/day: occasional     Alcohol type: vodka     Smoking Status: current     Packs/Day: 0.25     Year Quit: 2004     Pack years: 1/2 ppd  Caffeine-Diet-Exercise     Caffeine use/day: 1 cup of coffee per day     Does Patient Exercise: yes     Type of exercise: sit ups,crunches     Exercise (avg: min/session): <30     Times/week: 3  Safety-Violence-Falls     Seat Belt Use: yes      Sexual History:  n/a.        Drug Use:  former.     Updated Prior Medication List: REYATAZ 300 MG CAPS (ATAZANAVIR SULFATE) Take 1 capsule by mouth once a day NORVIR 100 MG CAPS (RITONAVIR) Take 1 capsule by mouth once a day TRUVADA 200-300 MG TABS (EMTRICITABINE-TENOFOVIR) Take 1 tablet by mouth once a day SEROQUEL 25 MG TABS (QUETIAPINE FUMARATE)  NEURONTIN 300 MG CAPS (GABAPENTIN) three times a day WELLBUTRIN XL 150 MG TB24 (BUPROPION HCL) Take 1 tablet by mouth once a day PROAIR HFA 108 (90 BASE) MCG/ACT AERS (ALBUTEROL SULFATE) one puff every6 hours as needed ENSURE  LIQD (NUTRITIONAL SUPPLEMENTS) one can two times a day BENTYL 20 MG TABS (DICYCLOMINE HCL) 1 tablet four times daily NASACORT AQ 55 MCG/ACT AERS (TRIAMCINOLONE ACETONIDE) one spray two times a day  Current Allergies (reviewed today): No known allergies  Past History:  Past Medical History: Last updated: 03/07/2006 Depression HIV disease  Review of Systems  The patient denies anorexia, fever, and weight loss.    Vital Signs:  Patient profile:   50 year old male Height:      75 inches (190.50 cm) Weight:      229.4 pounds (104.27 kg) BMI:     28.78 Temp:     98.5 degrees F (36.94 degrees C) oral Pulse  rate:   89 / minute BP sitting:   105 / 66  (right arm)  Vitals Entered By: Wendall Mola CMA Duncan Dull) (April 11, 2010 9:50 AM) CC: follow-up visit, lab results Is Patient Diabetic? No Pain Assessment Patient in pain? no      Nutritional Status BMI of 25 - 29 = overweight Nutritional Status Detail appetite "good"  Have you ever been in a relationship where you felt threatened, hurt or afraid?No   Does patient need assistance? Functional Status Self care Ambulation Normal Comments no missed doses of meds per pt.   Physical Exam  General:  alert, well-developed, well-nourished, and well-hydrated.   Head:  normocephalic and atraumatic.   Mouth:  pharynx pink and moist.   Lungs:  normal breath sounds.      Impression & Recommendations:  Problem # 1:  HIV DISEASE (ICD-042) Pt.s most recent CD4ct was 320 and VL <20 .  Pt instructed to continue the current antiretroviral regimen.  Pt encouraged to take medication regularly and not miss doses.  Pt will f/u in 3 months for repeat blood work and will see me 2 weeks later.  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 Patient Level III (56213) Future Orders: T-CD4SP ([facility]) (CD4SP) ... 07/10/2010 T-HIV Viral Load [id number]) ... 07/10/2010 T-Comprehensive Metabolic Panel [id number]) ... 07/10/2010 T-CBC w/Diff ([id number]) ... 07/10/2010 T-RPR (Syphilis) [id number]) ... 07/10/2010  Patient Instructions: 1)  Please schedule a follow-up appointment in 3 months, 2 weeks after labs.

## 2010-06-19 NOTE — Progress Notes (Signed)
Summary: coughing, chest hurting, given appt.  Phone Note Call from Patient Call back at Home Phone [phone]   Caller: Patient Call For: Yisroel Ramming MD Reason for Call: Acute Illness Action Taken: Phone Call Completed, Appt Scheduled Today Summary of Call: Coughing, chest hurting.  Requesting appt. Jennet Maduro RN  July 27, 2008 10:08 AM

## 2010-06-19 NOTE — Letter (Signed)
Summary: Juanell Fairly 02/12/06  Ryan White 02/12/06   Imported By: Jennet Maduro RN 04/24/2006 18:34:19  _____________________________________________________________________  External Attachment:    Type:   Image     Comment:   External Document

## 2010-06-19 NOTE — Assessment & Plan Note (Signed)
Summary: F/U/VS   CC:  follow-up visit and lab results.  History of Present Illness: Pt feels well.  The bentyl has helped his nausea and vomiting and diarrhea. No new problems. No missed doses of his HIV meds.  Preventive Screening-Counseling & Management  Alcohol-Tobacco     Alcohol drinks/day: occasional     Alcohol type: vodka     Smoking Status: never     Year Quit: 2004     Pack years: 1/2 ppd  Caffeine-Diet-Exercise     Caffeine use/day: 1 cup of coffee per day     Does Patient Exercise: yes     Type of exercise: sit ups,crunches     Exercise (avg: min/session): <30     Times/week: 3  Safety-Violence-Falls     Seat Belt Use: yes      Sexual History:  n/a.     Updated Prior Medication List: REYATAZ 300 MG CAPS (ATAZANAVIR SULFATE) Take 1 capsule by mouth once a day NORVIR 100 MG CAPS (RITONAVIR) Take 1 capsule by mouth once a day TRUVADA 200-300 MG TABS (EMTRICITABINE-TENOFOVIR) Take 1 tablet by mouth once a day SEROQUEL 25 MG TABS (QUETIAPINE FUMARATE)  NEURONTIN 300 MG CAPS (GABAPENTIN) three times a day WELLBUTRIN XL 150 MG TB24 (BUPROPION HCL) Take 1 tablet by mouth once a day PROAIR HFA 108 (90 BASE) MCG/ACT AERS (ALBUTEROL SULFATE) one puff every6 hours as needed ENSURE  LIQD (NUTRITIONAL SUPPLEMENTS) one can two times a day BENTYL 20 MG TABS (DICYCLOMINE HCL) 1 tablet four times daily  Current Allergies (reviewed today): No known allergies  Past History:  Past Medical History: Last updated: 03/07/2006 Depression HIV disease  Social History: Sexual History:  n/a  Review of Systems  The patient denies anorexia, fever, weight loss, abdominal pain, and severe indigestion/heartburn.    Vital Signs:  Patient profile:   50 year old male Height:      75 inches (190.50 cm) Weight:      215.6 pounds (98.00 kg) BMI:     27.05 Temp:     98.0 degrees F (36.67 degrees C) oral Pulse rate:   89 / minute BP sitting:   107 / 71  (left arm)  Vitals  Entered By: Wendall Mola CMA Duncan Dull) (August 09, 2009 10:08 AM) CC: follow-up visit, lab results Is Patient Diabetic? No Pain Assessment Patient in pain? no      Nutritional Status BMI of 25 - 29 = overweight Nutritional Status Detail appetite "good"  Does patient need assistance? Functional Status Self care Ambulation Normal Comments no missed doses of meds per patient   Physical Exam  General:  alert, well-developed, well-nourished, and well-hydrated.   Head:  normocephalic and atraumatic.   Mouth:  pharynx pink and moist.   Lungs:  normal breath sounds.      Impression & Recommendations:  Problem # 1:  HIV DISEASE (ICD-042) Pt.s most recent CD4ct was 380 and VL 299 .  Pt instructed to continue the current antiretroviral regimen.  Pt encouraged to take medication regularly and not miss doses.  Pt will f/u in 3 months for repeat blood work and will see me 2 weeks later.  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 Patient Level III (16109) Future Orders: T-CD4SP ([facility]) (CD4SP) ... 11/07/2009 T-HIV Viral Load [phone]) ... 11/07/2009 T-Comprehensive Metabolic Panel [id number]) ... 11/07/2009 T-CBC w/Diff ([id number]) ... 11/07/2009  Patient Instructions: 1)  Please schedule a follow-up appointment in 3 months, 2 weeks after labs.  Process Orders Check Orders Results:     [facility]: Check successful Tests Sent for requisitioning (August 09, 2009 10:20 AM):     11/07/2009: [facility] -- T-HIV Viral Load [phone] (signed)     11/07/2009: [facility] -- T-Comprehensive Metabolic Panel [[id number]] (signed)      11/07/2009: [facility] -- [facility] w/Diff [[id number]] (signed)

## 2010-06-19 NOTE — Letter (Signed)
Summary: OV 01/08/06  OV 01/08/06   Imported By: Jennet Maduro RN 04/24/2006 18:39:34  _____________________________________________________________________  External Attachment:    Type:   Image     Comment:   External Document

## 2010-06-19 NOTE — Assessment & Plan Note (Signed)
Summary: STOMACH PROBLEM/SB.   CC:  loss of appetite, diarrhea, and stomach cramps.  History of Present Illness: For the past month patient has had intermittant nausea associated with eating, then crampy abdominal pain and diarrhea. He vomited one time.  No change in meds or diet other thatn he does not eat on a regular schedule.  He is lactose intolerant so does not consume dairy products. He drinks bottled water.  No history of recent travel.  No blood in his stool and no heartburn.  Preventive Screening-Counseling & Management  Alcohol-Tobacco     Alcohol drinks/day: 0     Alcohol type: vodka     Smoking Status: never     Year Quit: 2004     Pack years: 1/2 ppd  Caffeine-Diet-Exercise     Caffeine use/day: 1 cup of coffee per day     Does Patient Exercise: yes     Type of exercise: sit ups,crunches     Exercise (avg: min/session): <30     Times/week: 3  Safety-Violence-Falls     Seat Belt Use: yes   Updated Prior Medication List: REYATAZ 300 MG CAPS (ATAZANAVIR SULFATE) Take 1 capsule by mouth once a day NORVIR 100 MG CAPS (RITONAVIR) Take 1 capsule by mouth once a day TRUVADA 200-300 MG TABS (EMTRICITABINE-TENOFOVIR) Take 1 tablet by mouth once a day SEROQUEL 25 MG TABS (QUETIAPINE FUMARATE)  NEURONTIN 300 MG CAPS (GABAPENTIN) three times a day WELLBUTRIN XL 150 MG TB24 (BUPROPION HCL) Take 1 tablet by mouth once a day PROAIR HFA 108 (90 BASE) MCG/ACT AERS (ALBUTEROL SULFATE) one puff every6 hours as needed ENSURE  LIQD (NUTRITIONAL SUPPLEMENTS) one can two times a day  Current Allergies (reviewed today): No known allergies  Past History:  Past Medical History: Last updated: 03/07/2006 Depression HIV disease  Review of Systems  The patient denies anorexia, fever, abdominal pain, melena, hematochezia, and severe indigestion/heartburn.    Vital Signs:  Patient profile:   50 year old male Height:      75 inches (190.50 cm) Weight:      217 pounds (98.64  kg) BMI:     27.22 Temp:     97.1 degrees F oral Pulse rate:   81 / minute BP sitting:   110 / 72  (left arm)  Vitals Entered By: Starleen Arms CMA (July 05, 2009 3:13 PM) CC: loss of appetite, diarrhea, stomach cramps Is Patient Diabetic? No Pain Assessment Patient in pain? no      Nutritional Status BMI of 25 - 29 = overweight Nutritional Status Detail nl  Does patient need assistance? Functional Status Self care Ambulation Normal   Physical Exam  General:  alert, well-developed, well-nourished, and well-hydrated.   Head:  normocephalic and atraumatic.   Lungs:  normal breath sounds.   Abdomen:  soft, non-tender, normal bowel sounds, no distention, no masses, no guarding, no rigidity, and no rebound tenderness.     Impression & Recommendations:  Problem # 1:  DIARRHEA (ICD-787.91) associated with nausea and crampy abdominal pain wil check LFTs and amylase and lipase if negative consider trial of bentyl Orders: T-Comprehensive Metabolic Panel ([id number]) T-Amylase ([id number]) T-Lipase ([id number]) Est. Patient Level III (13244) Process Orders Check Orders Results:     [facility]: Check successful Tests Sent for requisitioning (July 05, 2009 3:28 PM):     07/05/2009: [facility] -- T-Comprehensive Metabolic Panel [[id number]] (signed)     07/05/2009: [facility] -- T-Amylase [phone] (signed)  07/05/2009: [facility] -- T-Lipase [phone] (signed)

## 2010-06-19 NOTE — Assessment & Plan Note (Signed)
Summary: est-ck/fu/meds/cfb  Flu Vaccine Consent Questions     Do you have a history of severe allergic reactions to this vaccine? no    Any prior history of allergic reactions to egg and/or gelatin? no    Do you have a sensitivity to the preservative Thimersol? no    Do you have a past history of Guillan-Barre Syndrome? no    Do you currently have an acute febrile illness? no    Have you ever had a severe reaction to latex? no    Vaccine information given and explained to patient? yes    Are you currently pregnant? no    Lot [id number]   Exp Date:08/17/2009   Manufacturer: [employer]    Site Given  Left Deltoid IM Jennet Maduro RN  April 26, 2009 11:10 AM  CC:  f/u  and Depression.  History of Present Illness: Pt here for f/u.  He has been doing well.  No missed doses of his HIV meds.  Depression History:      The patient denies a depressed mood most of the day and a diminished interest in his usual daily activities.        The patient denies that he feels like life is not worth living, denies that he wishes that he were dead, and denies that he has thought about ending his life.        Preventive Screening-Counseling & Management  Alcohol-Tobacco     Alcohol drinks/day: 0     Alcohol type: vodka     Smoking Status: never     Year Quit: 2004     Pack years: 1/2 ppd  Caffeine-Diet-Exercise     Caffeine use/day: 1 cup of coffee per day     Does Patient Exercise: yes     Type of exercise: sit ups,crunches     Exercise (avg: min/session): <30     Times/week: 3  Safety-Violence-Falls     Seat Belt Use: yes   Updated Prior Medication List: REYATAZ 300 MG CAPS (ATAZANAVIR SULFATE) Take 1 capsule by mouth once a day NORVIR 100 MG CAPS (RITONAVIR) Take 1 capsule by mouth once a day TRUVADA 200-300 MG TABS (EMTRICITABINE-TENOFOVIR) Take 1 tablet by mouth once a day SEROQUEL 25 MG TABS (QUETIAPINE FUMARATE)  NEURONTIN 300 MG CAPS (GABAPENTIN) three times a  day WELLBUTRIN XL 150 MG TB24 (BUPROPION HCL) Take 1 tablet by mouth once a day PROAIR HFA 108 (90 BASE) MCG/ACT AERS (ALBUTEROL SULFATE) one puff every6 hours as needed ENSURE  LIQD (NUTRITIONAL SUPPLEMENTS) one can two times a day  Current Allergies (reviewed today): No known allergies  Past History:  Past Medical History: Last updated: 03/07/2006 Depression HIV disease  Review of Systems  The patient denies anorexia, fever, and weight loss.    Vital Signs:  Patient profile:   50 year old male Height:      75 inches (190.50 cm) Weight:      219 pounds (99.55 kg) BMI:     27.47 Temp:     96.5 degrees F (35.83 degrees C) oral Pulse rate:   86 / minute BP sitting:   103 / 67  (right arm)  Vitals Entered By: Starleen Arms CMA (April 26, 2009 10:24 AM) CC: f/u , Depression Is Patient Diabetic? No Pain Assessment Patient in pain? no      Nutritional Status BMI of 25 - 29 = overweight Nutritional Status Detail nl  Does patient need assistance? Functional Status Self care Ambulation  Normal   Physical Exam  General:  alert, well-developed, well-nourished, and well-hydrated.   Head:  normocephalic and atraumatic.   Lungs:  normal breath sounds.      Impression & Recommendations:  Problem # 1:  HIV DISEASE (ICD-042) Pt.s most recent CD4ct was 300 and VL <48 .  Pt instructed to continue the current antiretroviral regimen.  Pt encouraged to take medication regularly and not miss doses.  Pt will f/u in 3 months for repeat blood work and will see me 2 weeks later.  Influenza vaccine given today.  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 One can two times a  day  Other Orders: Est. Patient Level III (95621) Flu Vaccine 90yrs + (30865) Administration Flu vaccine - MCR (H8469) Future Orders: T-CD4SP ([facility]) (CD4SP) ... 07/25/2009 T-HIV Viral Load [phone]) ... 07/25/2009 T-Comprehensive Metabolic Panel [phone]) ... 07/25/2009 T-CBC w/Diff ([id number]) ... 07/25/2009  Patient Instructions: 1)  Please schedule a follow-up appointment in 3 months, 2 weeks after labs.  Prescriptions: ENSURE  LIQD (NUTRITIONAL SUPPLEMENTS) one can two times a day  #2 cases x prn   Entered and Authorized by:   Yisroel Ramming MD   Signed by:   Yisroel Ramming MD on 04/26/2009   Method used:   Print then Give to Patient   RxID:   [id number]  Process Orders Check Orders Results:     [facility]: Check successful Tests Sent for requisitioning (April 26, 2009 3:42 PM):     07/25/2009: [facility] -- T-HIV Viral Load [phone] (signed)     07/25/2009: [facility] -- T-Comprehensive Metabolic Panel [[id number]] (signed)     07/25/2009: [facility] -- [facility] w/Diff [[id number]] (signed)

## 2010-06-19 NOTE — Progress Notes (Signed)
Summary: Truvada refill 10-21-06/mld  Phone Note Refill Request Message from:  Fax from Pharmacy  Refills Requested: Medication #1:  TRUVADA 200-300 MG TABS Take 1 tablet by mouth once a day   Dosage confirmed as above?Dosage Confirmed   Last Refilled: 09/18/2006 Initial call taken by: Paulo Fruit,  October 21, 2006 4:16 PM    Prescriptions: TRUVADA 200-300 MG TABS (EMTRICITABINE-TENOFOVIR) Take 1 tablet by mouth once a day  #30 x 5   Entered by:   Paulo Fruit   Authorized by:   Yisroel Ramming MD   Signed by:   Paulo Fruit on 10/21/2006   Method used:   Telephoned to ...       [facility] [phone]       [address]       [place], [place]  [address]  [place]       Ph: [phone]       Fax: [phone]   RxID:   [id number]  ...................................................................Paulo Fruit  October 21, 2006 4:17 PM

## 2010-06-19 NOTE — Miscellaneous (Signed)
Summary: CD4  Clinical Lists Changes  Observations: Added new observation of CD4 COUNT: 220 microliters (11/18/2006 15:07)

## 2010-06-19 NOTE — Assessment & Plan Note (Signed)
Summary: sinus conges/jc   CC:  pt. c/o sinus congestion x 2 weeks.  History of Present Illness: Pt c/o sinus congestion and pressure not responding to OTC meds for 2 weeks. Drainage is mostly clear.  Depression History:      The patient denies a depressed mood most of the day and a diminished interest in his usual daily activities.        The patient denies that he feels like life is not worth living, denies that he wishes that he were dead, and denies that he has thought about ending his life.        Preventive Screening-Counseling & Management  Alcohol-Tobacco     Alcohol drinks/day: occasional     Alcohol type: vodka     Smoking Status: current     Packs/Day: 0.25     Year Quit: 2004     Pack years: 1/2 ppd  Caffeine-Diet-Exercise     Caffeine use/day: 1 cup of coffee per day     Does Patient Exercise: yes     Type of exercise: sit ups,crunches     Exercise (avg: min/session): <30     Times/week: 3  Safety-Violence-Falls     Seat Belt Use: yes      Sexual History:  n/a.        Drug Use:  former.    Comments: pt. declined condoms   Updated Prior Medication List: REYATAZ 300 MG CAPS (ATAZANAVIR SULFATE) Take 1 capsule by mouth once a day NORVIR 100 MG CAPS (RITONAVIR) Take 1 capsule by mouth once a day TRUVADA 200-300 MG TABS (EMTRICITABINE-TENOFOVIR) Take 1 tablet by mouth once a day SEROQUEL 25 MG TABS (QUETIAPINE FUMARATE)  NEURONTIN 300 MG CAPS (GABAPENTIN) three times a day WELLBUTRIN XL 150 MG TB24 (BUPROPION HCL) Take 1 tablet by mouth once a day PROAIR HFA 108 (90 BASE) MCG/ACT AERS (ALBUTEROL SULFATE) one puff every6 hours as needed ENSURE  LIQD (NUTRITIONAL SUPPLEMENTS) one can two times a day BENTYL 20 MG TABS (DICYCLOMINE HCL) 1 tablet four times daily NASACORT AQ 55 MCG/ACT AERS (TRIAMCINOLONE ACETONIDE) one spray two times a day AUGMENTIN 875-125 MG TABS (AMOXICILLIN-POT CLAVULANATE) Take 1 tablet by mouth two times a day  Current Allergies  (reviewed today): No known allergies  Past History:  Past Medical History: Last updated: 03/07/2006 Depression HIV disease  Review of Systems  The patient denies anorexia, fever, and weight loss.    Vital Signs:  Patient profile:   50 year old male Height:      75 inches (190.50 cm) Weight:      228.0 pounds (103.64 kg) BMI:     28.60 Temp:     98.1 degrees F (36.72 degrees C) oral Pulse rate:   83 / minute BP sitting:   108 / 72  (right arm)  Vitals Entered By: Wendall Mola CMA Duncan Dull) (February 16, 2010 11:18 AM) CC: pt. c/o sinus congestion x 2 weeks Is Patient Diabetic? No Pain Assessment Patient in pain? no      Nutritional Status BMI of 25 - 29 = overweight Nutritional Status Detail appetite "good"  Does patient need assistance? Functional Status Self care Ambulation Normal Comments no missed doses of meds per pt.   Physical Exam  General:  alert, well-developed, well-nourished, and well-hydrated.   Head:  normocephalic and atraumatic.   Mouth:  pharynx pink and moist.   Lungs:  normal breath sounds.     Impression & Recommendations:  Problem # 1:  ACUTE SINUSITIS, UNSPECIFIED (ICD-461.9) treat with AUgmentin and nasocort His updated medication list for this problem includes:    Nasacort Aq 55 Mcg/act Aers (Triamcinolone acetonide) ..... One spray two times a day    Augmentin 875-125 Mg Tabs (Amoxicillin-pot clavulanate) .Marland Kitchen... Take 1 tablet by mouth two times a day  Orders: Est. Patient Level III (04540)  Medications Added to Medication List This Visit: 1)  Nasacort Aq 55 Mcg/act Aers (Triamcinolone acetonide) .... One spray two times a day 2)  Augmentin 875-125 Mg Tabs (Amoxicillin-pot clavulanate) .... Take 1 tablet by mouth two times a day Prescriptions: AUGMENTIN 875-125 MG TABS (AMOXICILLIN-POT CLAVULANATE) Take 1 tablet by mouth two times a day  #20 x 0   Entered and Authorized by:   Yisroel Ramming MD   Signed by:   Yisroel Ramming  MD on 02/16/2010   Method used:   Print then Give to Patient   RxID:   [id number] NASACORT AQ 55 MCG/ACT AERS (TRIAMCINOLONE ACETONIDE) one spray two times a day  #1 x 0   Entered and Authorized by:   Yisroel Ramming MD   Signed by:   Yisroel Ramming MD on 02/16/2010   Method used:   Print then Give to Patient   RxID:   [id number]       Appended Document: Orders Update    Clinical Lists Changes  Orders: Added new Service order of Influenza Vaccine MCR [phone]) - Signed Observations: Added new observation of FLU VAX#1VIS: 12/12/09 version given February 16, 2010. (02/16/2010 12:07) Added new observation of FLU VAXLOT: 1103 3P (02/16/2010 12:07) Added new observation of FLU VAX EXP: 08/19/2010 (02/16/2010 12:07) Added new observation of FLU VAXBY: Wendall Mola CMA ( AAMA) (02/16/2010 12:07) Added new observation of FLU VAXRTE: IM (02/16/2010 12:07) Added new observation of FLU VAX DSE: 0.5 ml (02/16/2010 12:07) Added new observation of FLU VAXMFR: [employer] (02/16/2010 12:07) Added new observation of FLU VAX SITE: right deltoid (02/16/2010 12:07) Added new observation of FLU VAX: Fluvax MCR (02/16/2010 12:07)       Immunizations Administered:  Influenza Vaccine # 1:    Vaccine Type: Fluvax MCR    Site: right deltoid    Mfr: [employer]    Dose: 0.5 ml    Route: IM    Given by: Wendall Mola CMA ( AAMA)    Exp. Date: 08/19/2010    Lot #: [id number]    VIS given: 12/12/09 version given February 16, 2010.  Flu Vaccine Consent Questions:    Do you have a history of severe allergic reactions to this vaccine? no    Any prior history of allergic reactions to egg and/or gelatin? no    Do you have a sensitivity to the preservative Thimersol? no    Do you have a past history of Guillan-Barre Syndrome? no    Do you currently have an acute febrile illness? no    Have you ever had a severe reaction to latex? no    Vaccine information given and explained to  patient? yes

## 2010-06-19 NOTE — Assessment & Plan Note (Signed)
Summary: 83month f/u [mkj]   CC:  follow-up visit and needs refill on Bentyl and Gabapentin.  History of Present Illness: Pt doing well. No missed doses of his HIV meds.  He would likt to stop smoking but needs some assistance.  Depression History:      The patient denies a depressed mood most of the day and a diminished interest in his usual daily activities.        The patient denies that he feels like life is not worth living, denies that he wishes that he were dead, and denies that he has thought about ending his life.        Preventive Screening-Counseling & Management  Alcohol-Tobacco     Alcohol drinks/day: occasional     Alcohol type: vodka     Smoking Status: current     Packs/Day: 0.25     Year Quit: 2004     Pack years: 1/2 ppd  Caffeine-Diet-Exercise     Caffeine use/day: 1 cup of coffee per day     Does Patient Exercise: yes     Type of exercise: sit ups,crunches     Exercise (avg: min/session): <30     Times/week: 3  Safety-Violence-Falls     Seat Belt Use: yes      Sexual History:  n/a.        Drug Use:  former.    Comments: pt. declined condoms   Updated Prior Medication List: REYATAZ 300 MG CAPS (ATAZANAVIR SULFATE) Take 1 capsule by mouth once a day NORVIR 100 MG CAPS (RITONAVIR) Take 1 capsule by mouth once a day TRUVADA 200-300 MG TABS (EMTRICITABINE-TENOFOVIR) Take 1 tablet by mouth once a day SEROQUEL 25 MG TABS (QUETIAPINE FUMARATE)  NEURONTIN 300 MG CAPS (GABAPENTIN) three times a day WELLBUTRIN XL 150 MG TB24 (BUPROPION HCL) Take 1 tablet by mouth once a day PROAIR HFA 108 (90 BASE) MCG/ACT AERS (ALBUTEROL SULFATE) one puff every6 hours as needed ENSURE  LIQD (NUTRITIONAL SUPPLEMENTS) one can two times a day BENTYL 20 MG TABS (DICYCLOMINE HCL) 1 tablet four times daily NICODERM CQ 14 MG/24HR PT24 (NICOTINE) apply one patch a day  Current Allergies (reviewed today): No known allergies  Past History:  Past Medical History: Last updated:  03/07/2006 Depression HIV disease  Review of Systems  The patient denies anorexia, fever, and weight loss.    Vital Signs:  Patient profile:   50 year old male Height:      75 inches (190.50 cm) Weight:      219.8 pounds (99.91 kg) BMI:     27.57 Temp:     98.2 degrees F (36.78 degrees C) oral Pulse rate:   86 / minute BP sitting:   120 / 71  (right arm)  Vitals Entered By: Wendall Mola CMA Duncan Dull) (December 27, 2009 8:58 AM) CC: follow-up visit, needs refill on Bentyl and Gabapentin Is Patient Diabetic? No Pain Assessment Patient in pain? no      Nutritional Status BMI of 25 - 29 = overweight Nutritional Status Detail appetite "good"  Does patient need assistance? Functional Status Self care Ambulation Normal Comments no missed doses of meds per patient would like something to help with quitting smoking, possibly nicotine patch   Physical Exam  General:  alert, well-developed, well-nourished, and well-hydrated.   Head:  normocephalic and atraumatic.   Mouth:  pharynx pink and moist.   Lungs:  normal breath sounds.      Impression & Recommendations:  Problem #  1:  HIV DISEASE (ICD-042) Pt.s most recent CD4ct was 480 and VL <48 .  Pt instructed to continue the current antiretroviral regimen.  Pt encouraged to take medication regularly and not miss doses.  Pt will f/u in 3 months for repeat blood work and will see me 2 weeks later.  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 Apply one patch a day  Medications Added to Medication List This Visit: 1)  Nicoderm Cq 14 Mg/24hr Pt24  (Nicotine) .... Apply one patch a day  Other Orders: Est. Patient Level III (16109) Future Orders: T-CD4SP ([facility]) (CD4SP) ... 03/27/2010 T-HIV Viral Load [phone]) ... 03/27/2010 T-Comprehensive Metabolic Panel [phone]) ... 03/27/2010 T-CBC w/Diff ([id number]) ... 03/27/2010  Patient Instructions: 1)  Please schedule a follow-up appointment in 3 months, 2 weeks after labs.  Prescriptions: NEURONTIN 300 MG CAPS (GABAPENTIN) three times a day  #90 Each x 5   Entered and Authorized by:   Yisroel Ramming MD   Signed by:   Yisroel Ramming MD on 12/27/2009   Method used:   Print then Give to Patient   RxID:   [id number] NICODERM CQ 14 MG/24HR PT24 (NICOTINE) apply one patch a day  #30 x 1   Entered and Authorized by:   Yisroel Ramming MD   Signed by:   Yisroel Ramming MD on 12/27/2009   Method used:   Print then Give to Patient   RxID:   [id number] BENTYL 20 MG TABS (DICYCLOMINE HCL) 1 tablet four times daily  #120.0 Each x 5   Entered and Authorized by:   Yisroel Ramming MD   Signed by:   Yisroel Ramming MD on 12/27/2009   Method used:   Print then Give to Patient   RxID:   [id number]

## 2010-06-19 NOTE — Miscellaneous (Signed)
Summary: Lab Rpt: CD4  Clinical Lists Changes  Observations: Added new observation of CD4 COUNT: 200 microliters (09/02/2006 15:09)

## 2010-06-19 NOTE — Assessment & Plan Note (Signed)
Summary: sinus and chest congestion/tkk   CC:  pt. c/o cough and sinus congestion x 3 days.  History of Present Illness: Pt started with allergies and then developed sinus and chest congestion and cough that is mostly non-productive.  No fever or chills.  Preventive Screening-Counseling & Management  Alcohol-Tobacco     Alcohol drinks/day: occasional     Alcohol type: vodka     Smoking Status: never     Year Quit: 2004     Pack years: 1/2 ppd  Caffeine-Diet-Exercise     Caffeine use/day: 1 cup of coffee per day     Does Patient Exercise: yes     Type of exercise: sit ups,crunches     Exercise (avg: min/session): <30     Times/week: 3  Safety-Violence-Falls     Seat Belt Use: yes      Sexual History:  n/a.        Drug Use:  former.     Updated Prior Medication List: REYATAZ 300 MG CAPS (ATAZANAVIR SULFATE) Take 1 capsule by mouth once a day NORVIR 100 MG CAPS (RITONAVIR) Take 1 capsule by mouth once a day TRUVADA 200-300 MG TABS (EMTRICITABINE-TENOFOVIR) Take 1 tablet by mouth once a day SEROQUEL 25 MG TABS (QUETIAPINE FUMARATE)  NEURONTIN 300 MG CAPS (GABAPENTIN) three times a day WELLBUTRIN XL 150 MG TB24 (BUPROPION HCL) Take 1 tablet by mouth once a day PROAIR HFA 108 (90 BASE) MCG/ACT AERS (ALBUTEROL SULFATE) one puff every6 hours as needed ENSURE  LIQD (NUTRITIONAL SUPPLEMENTS) one can two times a day BENTYL 20 MG TABS (DICYCLOMINE HCL) 1 tablet four times daily ZITHROMAX Z-PAK 250 MG TABS (AZITHROMYCIN) take as directed  Current Allergies (reviewed today): No known allergies  Past History:  Past Medical History: Last updated: 03/07/2006 Depression HIV disease  Review of Systems  The patient denies anorexia, fever, weight loss, dyspnea on exertion, and hemoptysis.    Vital Signs:  Patient profile:   50 year old male Height:      75 inches (190.50 cm) Weight:      220.0 pounds (100.00 kg) BMI:     27.60 Temp:     98.3 degrees F (36.83 degrees C)  oral Pulse rate:   88 / minute BP sitting:   96 / 67  (right arm)  Vitals Entered By: Wendall Mola CMA ( AAMA) (October 25, 2009 9:30 AM) CC: pt. c/o cough, sinus congestion x 3 days Is Patient Diabetic? No Pain Assessment Patient in pain? no      Nutritional Status BMI of 25 - 29 = overweight Nutritional Status Detail appetite "normal"  Does patient need assistance? Functional Status Self care Ambulation Normal Comments no missed doses of meds per patient   Physical Exam  General:  alert, well-developed, well-nourished, and well-hydrated.   Head:  normocephalic and atraumatic.   Mouth:  pharynx pink and moist.   Lungs:  normal breath sounds.     Impression & Recommendations:  Problem # 1:  COUGH (ICD-786.2) will treat for bronchitis with z-pack Orders: Est. Patient Level III (16109)  Medications Added to Medication List This Visit: 1)  Zithromax Z-pak 250 Mg Tabs (Azithromycin) .... Take as directed Prescriptions: ZITHROMAX Z-PAK 250 MG TABS (AZITHROMYCIN) take as directed  #1 pack x 0   Entered and Authorized by:   Yisroel Ramming MD   Signed by:   Yisroel Ramming MD on 10/25/2009   Method used:   Print then Give to Patient   RxID:  [id number]  

## 2010-06-19 NOTE — Miscellaneous (Signed)
Summary: immunizations/dde  Clinical Lists Changes  Observations: Added new observation of FLU VAX: Historical (02/12/2006 11:50)      Influenza Immunization History:    Influenza # 1:  Historical (02/12/2006)

## 2010-06-19 NOTE — Miscellaneous (Signed)
Summary: Orders Update, add RPR  Clinical Lists Changes  Orders: Added new Test order of T-RPR (Syphilis) [phone]) - Signed

## 2010-06-19 NOTE — Assessment & Plan Note (Signed)
Summary: F/U [MKJ]   CC:  follow-up visit, lab results, and right eyelid swollen since yesterday.  History of Present Illness: Pt here for f/u.  He c/o swelling of his left eyelid.  It feels like something is in his eye. No visual changes. No missed doses of his HIv meds.  Preventive Screening-Counseling & Management  Alcohol-Tobacco     Alcohol drinks/day: occasional     Alcohol type: vodka     Smoking Status: never     Year Quit: 2004     Pack years: 1/2 ppd  Caffeine-Diet-Exercise     Caffeine use/day: 1 cup of coffee per day     Does Patient Exercise: yes     Type of exercise: sit ups,crunches     Exercise (avg: min/session): <30     Times/week: 3  Safety-Violence-Falls     Seat Belt Use: yes      Sexual History:  n/a.        Drug Use:  former.    Comments: pt declined condoms   Updated Prior Medication List: REYATAZ 300 MG CAPS (ATAZANAVIR SULFATE) Take 1 capsule by mouth once a day NORVIR 100 MG CAPS (RITONAVIR) Take 1 capsule by mouth once a day TRUVADA 200-300 MG TABS (EMTRICITABINE-TENOFOVIR) Take 1 tablet by mouth once a day SEROQUEL 25 MG TABS (QUETIAPINE FUMARATE)  NEURONTIN 300 MG CAPS (GABAPENTIN) three times a day WELLBUTRIN XL 150 MG TB24 (BUPROPION HCL) Take 1 tablet by mouth once a day PROAIR HFA 108 (90 BASE) MCG/ACT AERS (ALBUTEROL SULFATE) one puff every6 hours as needed ENSURE  LIQD (NUTRITIONAL SUPPLEMENTS) one can two times a day BENTYL 20 MG TABS (DICYCLOMINE HCL) 1 tablet four times daily ERYTHROMYCIN 5 MG/GM OINT (ERYTHROMYCIN) apply to left eye lid 4 times a day  Current Allergies (reviewed today): No known allergies  Past History:  Past Medical History: Last updated: 03/07/2006 Depression HIV disease  Social History: Drug Use:  former  Review of Systems  The patient denies anorexia, fever, weight loss, and vision loss.    Vital Signs:  Patient profile:   50 year old male Height:      75 inches (190.50 cm) Weight:       217.4 pounds (98.82 kg) BMI:     27.27 Temp:     97.4 degrees F (36.33 degrees C) oral Pulse rate:   80 / minute BP sitting:   109 / 72  (right arm)  Vitals Entered By: Wendall Mola CMA Duncan Dull) (Sep 28, 2009 9:12 AM) CC: follow-up visit, lab results, right eyelid swollen since yesterday Is Patient Diabetic? No Pain Assessment Patient in pain? no      Nutritional Status BMI of 25 - 29 = overweight Nutritional Status Detail appetite "good"  Have you ever been in a relationship where you felt threatened, hurt or afraid?No   Does patient need assistance? Functional Status Self care Ambulation Normal Comments no missed doses of meds per patient   Physical Exam  General:  alert, well-developed, well-nourished, and well-hydrated.   Head:  normocephalic and atraumatic.   Eyes:  no injection.  left eye lid slightly swollen and red Mouth:  no thrush  Lungs:  normal breath sounds.      Impression & Recommendations:  Problem # 1:  HIV DISEASE (ICD-042) Pt.s most recent CD4ct was 310 and VL <48 .  Pt instructed to continue the current antiretroviral regimen.  Pt encouraged to take medication regularly and not miss doses.  Pt will  f/u in 3 months for repeat blood work and will see me 2 weeks later.  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 Apply to left eye lid 4 times a day  Other Orders: Est. Patient Level III (45409) Future Orders: T-CD4SP ([facility]) (CD4SP) ... 12/27/2009 T-HIV Viral Load [phone]) ... 12/27/2009 T-Comprehensive Metabolic Panel [phone]) ... 12/27/2009 T-CBC w/Diff  ([id number]) ... 12/27/2009 T-Lipid Profile [phone]) ... 12/27/2009  Patient Instructions: 1)  Please schedule a follow-up appointment in 3 months, 2 weeks after labs.  Prescriptions: ERYTHROMYCIN 5 MG/GM OINT (ERYTHROMYCIN) apply to left eye lid 4 times a day  #3.5gm x 0   Entered and Authorized by:   Yisroel Ramming MD   Signed by:   Yisroel Ramming MD on 09/28/2009   Method used:   Print then Give to Patient   RxID:   [id number] TRUVADA 200-300 MG TABS (EMTRICITABINE-TENOFOVIR) Take 1 tablet by mouth once a day  #30.0 Each x 5   Entered and Authorized by:   Yisroel Ramming MD   Signed by:   Yisroel Ramming MD on 09/28/2009   Method used:   Print then Give to Patient   RxID:   [id number] NORVIR 100 MG CAPS (RITONAVIR) Take 1 capsule by mouth once a day  #30.0 Each x 5   Entered and Authorized by:   Yisroel Ramming MD   Signed by:   Yisroel Ramming MD on 09/28/2009   Method used:   Print then Give to Patient   RxID:   [id number] REYATAZ 300 MG CAPS (ATAZANAVIR SULFATE) Take 1 capsule by mouth once a day  #30.0 Each x 5   Entered and Authorized by:   Yisroel Ramming MD   Signed by:   Yisroel Ramming MD on 09/28/2009   Method used:   Print then Give to Patient   RxID:   [id number]

## 2010-07-09 ENCOUNTER — Encounter: Payer: Self-pay | Admitting: Infectious Diseases

## 2010-07-09 ENCOUNTER — Other Ambulatory Visit (INDEPENDENT_AMBULATORY_CARE_PROVIDER_SITE_OTHER): Payer: Medicare Other

## 2010-07-09 ENCOUNTER — Encounter: Payer: Self-pay | Admitting: Adult Health

## 2010-07-09 ENCOUNTER — Other Ambulatory Visit: Payer: Self-pay | Admitting: Adult Health

## 2010-07-09 DIAGNOSIS — B20 Human immunodeficiency virus [HIV] disease: Secondary | ICD-10-CM

## 2010-07-09 LAB — CONVERTED CEMR LAB: HIV 1 RNA Quant: 49 copies/mL — ABNORMAL HIGH (ref <20)

## 2010-07-10 LAB — T-HELPER CELL (CD4) - (RCID CLINIC ONLY)
CD4 % Helper T Cell: 23 % — ABNORMAL LOW (ref 33–55)
CD4 T Cell Abs: 420 uL (ref 400–2700)

## 2010-07-11 LAB — CONVERTED CEMR LAB
ALT: 34 units/L (ref 0–53)
AST: 25 units/L (ref 0–37)
Basophils Absolute: 0 10*3/uL (ref 0.0–0.1)
Basophils Relative: 1 % (ref 0–1)
CO2: 25 meq/L (ref 19–32)
Chloride: 105 meq/L (ref 96–112)
Creatinine, Ser: 1.71 mg/dL — ABNORMAL HIGH (ref 0.40–1.50)
Eosinophils Absolute: 0.1 10*3/uL (ref 0.0–0.7)
Eosinophils Relative: 1 % (ref 0–5)
HCT: 52.5 % — ABNORMAL HIGH (ref 39.0–52.0)
MCHC: 33.9 g/dL (ref 30.0–36.0)
MCV: 104.2 fL — ABNORMAL HIGH (ref 78.0–100.0)
Neutrophils Relative %: 58 % (ref 43–77)
Platelets: 128 10*3/uL — ABNORMAL LOW (ref 150–400)
RDW: 13.5 % (ref 11.5–15.5)
Sodium: 138 meq/L (ref 135–145)
Total Bilirubin: 4.1 mg/dL — ABNORMAL HIGH (ref 0.3–1.2)
Total Protein: 7 g/dL (ref 6.0–8.3)

## 2010-07-23 ENCOUNTER — Encounter: Payer: Self-pay | Admitting: Infectious Diseases

## 2010-07-23 ENCOUNTER — Ambulatory Visit: Payer: Self-pay | Admitting: Internal Medicine

## 2010-07-23 ENCOUNTER — Ambulatory Visit (INDEPENDENT_AMBULATORY_CARE_PROVIDER_SITE_OTHER): Payer: Medicare Other | Admitting: Infectious Diseases

## 2010-07-23 DIAGNOSIS — Z23 Encounter for immunization: Secondary | ICD-10-CM

## 2010-07-23 DIAGNOSIS — B20 Human immunodeficiency virus [HIV] disease: Secondary | ICD-10-CM

## 2010-07-31 LAB — T-HELPER CELL (CD4) - (RCID CLINIC ONLY): CD4 % Helper T Cell: 23 % — ABNORMAL LOW (ref 33–55)

## 2010-07-31 NOTE — Assessment & Plan Note (Signed)
Summary: F/U/VS new to md   Vital Signs:  Patient profile:   50 year old male Height:      75 inches (190.50 cm) Weight:      227.12 pounds (103.24 kg) BMI:     28.49 Temp:     98.3 degrees F (36.83 degrees C) oral Pulse rate:   76 / minute BP sitting:   101 / 64  (right arm)  Vitals Entered By: Wendall Mola CMA Duncan Dull) (July 23, 2010 10:57 AM) CC: follow-up visit, lab results Is Patient Diabetic? No Pain Assessment Patient in pain? no      Nutritional Status BMI of 25 - 29 = overweight Nutritional Status Detail appetite "ok"  Have you ever been in a relationship where you felt threatened, hurt or afraid?No   Does patient need assistance? Functional Status Self care Ambulation Normal Comments no missed doses of meds per pt.   CC:  follow-up visit and lab results.  History of Present Illness: 50 yo M wiht hx of HIV/AIDS since 2004 when he was hospitalized with PCP, complicated by VDRF and CVA. He was started on KLT/CBV/TFV.  On TRV/ATVr since 2007. Has been doing well. No problems with meds. Prev balance prbomlems from his CVA have resolved.  CD4 420 and VL 49 (07-09-10)  Depression History:      The patient denies a depressed mood most of the day and a diminished interest in his usual daily activities.        The patient denies that he feels like life is not worth living, denies that he wishes that he were dead, and denies that he has thought about ending his life.        Preventive Screening-Counseling & Management  Alcohol-Tobacco     Alcohol drinks/day: occasional     Alcohol type: vodka     Smoking Status: current     Packs/Day: 0.25     Year Quit: 2004     Pack years: 1/2 ppd  Caffeine-Diet-Exercise     Caffeine use/day: 1 cup of coffee per day     Does Patient Exercise: yes     Type of exercise: sit ups,crunches     Exercise (avg: min/session): <30     Times/week: 3  Safety-Violence-Falls     Seat Belt Use: yes      Sexual History:   n/a.        Drug Use:  former.    Comments: pt. declined condoms  Current Medications (verified): 1)  Reyataz 300 Mg Caps (Atazanavir Sulfate) .... Take 1 Capsule By Mouth Once A Day 2)  Norvir 100 Mg Tabs (Ritonavir) .... Take 1 Tablet By Mouth Once A Day 3)  Truvada 200-300 Mg Tabs (Emtricitabine-Tenofovir) .... Take 1 Tablet By Mouth Once A Day 4)  Seroquel 25 Mg Tabs (Quetiapine Fumarate) 5)  Neurontin 300 Mg Caps (Gabapentin) .... Three Times A Day 6)  Wellbutrin Xl 150 Mg Tb24 (Bupropion Hcl) .... Take 1 Tablet By Mouth Once A Day 7)  Proair Hfa 108 (90 Base) Mcg/act Aers (Albuterol Sulfate) .... One Puff Every6 Hours As Needed 8)  Ensure  Liqd (Nutritional Supplements) .... One Can Two Times A Day 9)  Bentyl 20 Mg Tabs (Dicyclomine Hcl) .Marland Kitchen.. 1 Tablet Four Times Daily 10)  Nasacort Aq 55 Mcg/act Aers (Triamcinolone Acetonide) .... One Spray Two Times A Day  Allergies (verified): No Known Drug Allergies  Past History:  Past Medical History: Depression HIV disease  Genotype 2009 T69N, K103N/s, Y181C, G190A, M36I.   Family History: Family History Diabetes 1st degree relative- brother, father, grandfather SLE in mom's side of family  Social History: Current Smoker Alcohol use-yes  Physical Exam  General:  well-developed, well-nourished, well-hydrated, and overweight-appearing.   Eyes:  pupils equal, pupils round, and pupils reactive to light.   Mouth:  pharynx pink and moist and no exudates.   Neck:  no masses.   Lungs:  normal respiratory effort and normal breath sounds.   Heart:  normal rate, regular rhythm, and no murmur.   Abdomen:  soft, non-tender, and normal bowel sounds.     Impression & Recommendations:  Problem # 1:  HIV DISEASE (ICD-042) he is doing well, will cont his current rx's. he is not sexualy active. will have him back in 6 months with labs prior inluding hep A Ab, lipids.  Future Orders: T-Hepatitis A Antibody ([id number]) ...  10/21/2010  Problem # 2:  TOBACCO USER (ICD-305.1) encouraged to quit, could not affored patch.   Other Orders: Pneumococcal Vaccine (86578) Admin 1st Vaccine (46962) Est. Patient Level IV (95284) Future Orders: T-CD4SP ([facility]) (CD4SP) ... 10/21/2010 T-HIV Viral Load [phone]) ... 10/21/2010 T-Comprehensive Metabolic Panel [id number]) ... 10/21/2010 T-CBC w/Diff ([id number]) ... 10/21/2010 T-Lipid Profile [id number]) ... 10/21/2010 Prescriptions: NEURONTIN 300 MG CAPS (GABAPENTIN) three times a day  #270 Capsule x 2   Entered and Authorized by:   Johny Sax MD   Signed by:   Johny Sax MD on 07/23/2010   Method used:   Electronically to        [facility] [phone]* (retail)       [address]       [place], [place]  [address]       Ph: [phone]       Fax: [phone]   RxID:   [id number]    Orders Added: 1)  Pneumococcal Vaccine [90732] 2)  Admin 1st Vaccine [90471] 3)  T-CD4SP [facility]) [CD4SP] 4)  T-HIV Viral Load [phone] 5)  T-Comprehensive Metabolic Panel [[id number]] 6)  T-CBC w/Diff [[id number]] 7)  T-Lipid Profile [[id number]] 8)  Est. Patient Level IV [00938] 9)  T-Hepatitis A Antibody [[id number]]   Immunizations Administered:  Pneumonia Vaccine:    Vaccine Type: Pneumovax    Site: left deltoid    Mfr: Merck    Dose: 0.5 ml    Route: IM    Given by: Wendall Mola CMA ( AAMA)    Exp. Date: 12/14/2011    Lot #: [id number]    VIS given: 04/24/09 version given July 23, 2010.   Immunizations Administered:  Pneumonia Vaccine:    Vaccine Type: Pneumovax    Site: left deltoid    Mfr: Merck    Dose: 0.5 ml    Route: IM    Given by: Wendall Mola CMA ( AAMA)    Exp. Date: 12/14/2011    Lot #: [id number]    VIS given: 04/24/09 version given July 23, 2010.

## 2010-08-07 LAB — T-HELPER CELL (CD4) - (RCID CLINIC ONLY)
CD4 % Helper T Cell: 21 % — ABNORMAL LOW (ref 33–55)
CD4 T Cell Abs: 310 uL — ABNORMAL LOW (ref 400–2700)

## 2010-08-12 LAB — T-HELPER CELL (CD4) - (RCID CLINIC ONLY)
CD4 % Helper T Cell: 24 % — ABNORMAL LOW (ref 33–55)
CD4 T Cell Abs: 380 uL — ABNORMAL LOW (ref 400–2700)

## 2010-08-22 LAB — T-HELPER CELL (CD4) - (RCID CLINIC ONLY): CD4 T Cell Abs: 300 uL — ABNORMAL LOW (ref 400–2700)

## 2010-08-28 LAB — T-HELPER CELL (CD4) - (RCID CLINIC ONLY): CD4 T Cell Abs: 360 uL — ABNORMAL LOW (ref 400–2700)

## 2010-10-08 ENCOUNTER — Other Ambulatory Visit (INDEPENDENT_AMBULATORY_CARE_PROVIDER_SITE_OTHER): Payer: Medicare Other | Admitting: *Deleted

## 2010-10-08 DIAGNOSIS — R110 Nausea: Secondary | ICD-10-CM

## 2010-10-08 MED ORDER — DICYCLOMINE HCL 20 MG PO TABS: 20.0000 mg | ORAL_TABLET | Freq: Four times a day (QID) | ORAL | Status: DC

## 2011-01-11 ENCOUNTER — Other Ambulatory Visit: Payer: Self-pay | Admitting: *Deleted

## 2011-01-11 MED ORDER — AMOXICILLIN 500 MG PO CAPS: 500.0000 mg | ORAL_CAPSULE | Freq: Three times a day (TID) | ORAL | Status: AC

## 2011-01-30 ENCOUNTER — Other Ambulatory Visit (INDEPENDENT_AMBULATORY_CARE_PROVIDER_SITE_OTHER): Payer: Medicare Other

## 2011-01-30 DIAGNOSIS — B20 Human immunodeficiency virus [HIV] disease: Secondary | ICD-10-CM

## 2011-01-30 DIAGNOSIS — Z79899 Other long term (current) drug therapy: Secondary | ICD-10-CM

## 2011-01-30 DIAGNOSIS — Z113 Encounter for screening for infections with a predominantly sexual mode of transmission: Secondary | ICD-10-CM

## 2011-01-30 LAB — LIPID PANEL
HDL: 28 mg/dL — ABNORMAL LOW (ref >39)
LDL Cholesterol: 117 mg/dL — ABNORMAL HIGH (ref 0–99)
VLDL: 35 mg/dL (ref 0–40)

## 2011-01-30 LAB — CBC WITH DIFFERENTIAL/PLATELET
Basophils Absolute: 0 10*3/uL (ref 0.0–0.1)
Basophils Relative: 1 % (ref 0–1)
Eosinophils Absolute: 0.2 10*3/uL (ref 0.0–0.7)
Eosinophils Relative: 4 % (ref 0–5)
MCH: 36 pg — ABNORMAL HIGH (ref 26.0–34.0)
MCV: 104.7 fL — ABNORMAL HIGH (ref 78.0–100.0)
Platelets: 127 10*3/uL — ABNORMAL LOW (ref 150–400)
RDW: 13.8 % (ref 11.5–15.5)

## 2011-01-30 LAB — URINALYSIS, ROUTINE W REFLEX MICROSCOPIC
Bilirubin Urine: NEGATIVE
Glucose, UA: NEGATIVE mg/dL
Hgb urine dipstick: NEGATIVE
Ketones, ur: NEGATIVE mg/dL
Protein, ur: NEGATIVE mg/dL

## 2011-01-30 LAB — COMPLETE METABOLIC PANEL WITH GFR
CO2: 24 mEq/L (ref 19–32)
Creat: 1.87 mg/dL — ABNORMAL HIGH (ref 0.50–1.35)
GFR, Est African American: 47 mL/min — ABNORMAL LOW (ref >60)
GFR, Est Non African American: 39 mL/min — ABNORMAL LOW (ref >60)
Glucose, Bld: 169 mg/dL — ABNORMAL HIGH (ref 70–99)
Sodium: 137 mEq/L (ref 135–145)
Total Bilirubin: 3.7 mg/dL — ABNORMAL HIGH (ref 0.3–1.2)
Total Protein: 7.1 g/dL (ref 6.0–8.3)

## 2011-01-31 LAB — GC/CHLAMYDIA PROBE AMP, URINE: GC Probe Amp, Urine: NEGATIVE

## 2011-01-31 LAB — T-HELPER CELL (CD4) - (RCID CLINIC ONLY): CD4 T Cell Abs: 370 uL — ABNORMAL LOW (ref 400–2700)

## 2011-02-07 LAB — T-HELPER CELL (CD4) - (RCID CLINIC ONLY): CD4 % Helper T Cell: 15 — ABNORMAL LOW

## 2011-02-13 ENCOUNTER — Telehealth: Payer: Self-pay | Admitting: *Deleted

## 2011-02-13 ENCOUNTER — Encounter: Payer: Self-pay | Admitting: Infectious Diseases

## 2011-02-13 ENCOUNTER — Ambulatory Visit (INDEPENDENT_AMBULATORY_CARE_PROVIDER_SITE_OTHER): Payer: Medicare Other | Admitting: Infectious Diseases

## 2011-02-13 VITALS — BP 112/71 | HR 80 | Temp 97.9°F | Ht 75.0 in | Wt 222.0 lb

## 2011-02-13 DIAGNOSIS — Z79899 Other long term (current) drug therapy: Secondary | ICD-10-CM

## 2011-02-13 DIAGNOSIS — Z113 Encounter for screening for infections with a predominantly sexual mode of transmission: Secondary | ICD-10-CM

## 2011-02-13 DIAGNOSIS — Z23 Encounter for immunization: Secondary | ICD-10-CM

## 2011-02-13 DIAGNOSIS — R7309 Other abnormal glucose: Secondary | ICD-10-CM

## 2011-02-13 DIAGNOSIS — F17200 Nicotine dependence, unspecified, uncomplicated: Secondary | ICD-10-CM

## 2011-02-13 DIAGNOSIS — B20 Human immunodeficiency virus [HIV] disease: Secondary | ICD-10-CM

## 2011-02-13 NOTE — Assessment & Plan Note (Signed)
He is aware he needs to cut back on added sugar. i believe he needs PCP and needs to start oral agent. As well, needs to be seen by nephorology.

## 2011-02-13 NOTE — Telephone Encounter (Signed)
Patient scheduled appointment with PCP at [facility] in [facility] for 02/21/11 at 9:00 AM.  Patient given appt info and address. This medical facility is on patient's medicaid [place] access card even though he has never been seen there. Wendall Mola CMA

## 2011-02-13 NOTE — Assessment & Plan Note (Signed)
He is doing well, had a slight drop in CD4. Will cont his meds, offered condoms. Gets flu shot. Will see him back in 4 months.

## 2011-02-13 NOTE — Progress Notes (Signed)
  Subjective:    Patient ID: Mitchell Bird, male    DOB: 03/18/1961, 50 y.o.   MRN: [id number]  HPI 50 yo M wiht hx of HIV/AIDS since 2004 when he was hospitalized with PCP, complicated by VDRF and CVA. He was started on KLT/CBV/TFV then switched to TRV/ATVr since 2007. Has been doing well. No problems with meds.  CD4 370 and VL <20, Cr 1.87, Glc 169 (01-30-11).  Has had cough and congestion for ~1 week. No fever. Very little sputum. Has taken some OTC cough medicine but makes him sick to his stomach. No headache, has had some sinus fullness. No rhinorrhea.  No problems with meds.    Review of Systems  Eyes: Negative for visual disturbance.  Respiratory: Positive for cough. Negative for shortness of breath.   Gastrointestinal: Negative for diarrhea and constipation.  Genitourinary: Negative for dysuria, hematuria and difficulty urinating.       Objective:   Physical Exam  Constitutional: He appears well-developed and well-nourished.  Eyes: EOM are normal. Pupils are equal, round, and reactive to light.  Neck: Neck supple.  Cardiovascular: Normal rate, regular rhythm and normal heart sounds.   Pulmonary/Chest: Effort normal and breath sounds normal. No respiratory distress. He has no wheezes. He has no rales.  Abdominal: Soft. Bowel sounds are normal. There is no tenderness.  Musculoskeletal: He exhibits no edema.  Lymphadenopathy:    He has no cervical adenopathy.          Assessment & Plan:

## 2011-02-13 NOTE — Assessment & Plan Note (Signed)
Encouraged to quit. 

## 2011-02-20 LAB — T-HELPER CELL (CD4) - (RCID CLINIC ONLY)
CD4 % Helper T Cell: 17 — ABNORMAL LOW
CD4 T Cell Abs: 340 — ABNORMAL LOW

## 2011-03-06 LAB — T-HELPER CELL (CD4) - (RCID CLINIC ONLY): CD4 % Helper T Cell: 14 — ABNORMAL LOW

## 2011-04-15 ENCOUNTER — Other Ambulatory Visit: Payer: Self-pay | Admitting: *Deleted

## 2011-04-15 DIAGNOSIS — B20 Human immunodeficiency virus [HIV] disease: Secondary | ICD-10-CM

## 2011-04-15 MED ORDER — RITONAVIR 100 MG PO TABS: 100.0000 mg | ORAL_TABLET | Freq: Every day | ORAL | Status: DC

## 2011-04-15 MED ORDER — EMTRICITABINE-TENOFOVIR DF 200-300 MG PO TABS: 1.0000 | ORAL_TABLET | Freq: Every day | ORAL | Status: DC

## 2011-04-15 MED ORDER — ATAZANAVIR SULFATE 300 MG PO CAPS: 300.0000 mg | ORAL_CAPSULE | Freq: Every day | ORAL | Status: DC

## 2011-04-15 NOTE — Telephone Encounter (Signed)
Refills already completed.

## 2011-04-23 ENCOUNTER — Telehealth: Payer: Self-pay | Admitting: *Deleted

## 2011-04-23 NOTE — Telephone Encounter (Signed)
Received call from [facility], patient has appointment with Dr. Hyman Hopes for 05/02/11 at 9:30 AM.  Patient aware. Wendall Mola CMA

## 2011-05-13 ENCOUNTER — Other Ambulatory Visit: Payer: Self-pay | Admitting: *Deleted

## 2011-05-13 DIAGNOSIS — B20 Human immunodeficiency virus [HIV] disease: Secondary | ICD-10-CM

## 2011-05-13 MED ORDER — ATAZANAVIR SULFATE 300 MG PO CAPS: 300.0000 mg | ORAL_CAPSULE | Freq: Every day | ORAL | Status: DC

## 2011-05-13 MED ORDER — RITONAVIR 100 MG PO TABS: 100.0000 mg | ORAL_TABLET | Freq: Every day | ORAL | Status: DC

## 2011-05-13 MED ORDER — EMTRICITABINE-TENOFOVIR DF 200-300 MG PO TABS: 1.0000 | ORAL_TABLET | Freq: Every day | ORAL | Status: DC

## 2011-05-28 ENCOUNTER — Other Ambulatory Visit: Payer: Self-pay | Admitting: Infectious Diseases

## 2011-05-28 ENCOUNTER — Other Ambulatory Visit (INDEPENDENT_AMBULATORY_CARE_PROVIDER_SITE_OTHER): Payer: Medicare Other

## 2011-05-28 DIAGNOSIS — R7309 Other abnormal glucose: Secondary | ICD-10-CM

## 2011-05-28 DIAGNOSIS — Z113 Encounter for screening for infections with a predominantly sexual mode of transmission: Secondary | ICD-10-CM

## 2011-05-28 DIAGNOSIS — Z79899 Other long term (current) drug therapy: Secondary | ICD-10-CM

## 2011-05-28 DIAGNOSIS — B20 Human immunodeficiency virus [HIV] disease: Secondary | ICD-10-CM

## 2011-05-28 LAB — LIPID PANEL
Cholesterol: 188 mg/dL (ref 0–200)
VLDL: 26 mg/dL (ref 0–40)

## 2011-05-28 LAB — CBC
HCT: 50.8 % (ref 39.0–52.0)
MCHC: 34.1 g/dL (ref 30.0–36.0)
Platelets: 126 10*3/uL — ABNORMAL LOW (ref 150–400)
RDW: 13.9 % (ref 11.5–15.5)
WBC: 6.2 10*3/uL (ref 4.0–10.5)

## 2011-05-28 LAB — RPR

## 2011-05-28 LAB — COMPREHENSIVE METABOLIC PANEL
ALT: 25 U/L (ref 0–53)
AST: 20 U/L (ref 0–37)
CO2: 24 mEq/L (ref 19–32)
Chloride: 106 mEq/L (ref 96–112)
Sodium: 142 mEq/L (ref 135–145)
Total Bilirubin: 2.8 mg/dL — ABNORMAL HIGH (ref 0.3–1.2)
Total Protein: 7.2 g/dL (ref 6.0–8.3)

## 2011-05-28 LAB — HEMOGLOBIN A1C
Hgb A1c MFr Bld: 5.6 % (ref <5.7)
Mean Plasma Glucose: 114 mg/dL (ref <117)

## 2011-05-30 LAB — HIV-1 RNA QUANT-NO REFLEX-BLD
HIV 1 RNA Quant: <20 copies/mL (ref <20)
HIV-1 RNA Quant, Log: <1.3 {Log} (ref <1.30)

## 2011-06-10 ENCOUNTER — Ambulatory Visit (INDEPENDENT_AMBULATORY_CARE_PROVIDER_SITE_OTHER): Payer: Medicare Other | Admitting: Infectious Diseases

## 2011-06-10 ENCOUNTER — Encounter: Payer: Self-pay | Admitting: Infectious Diseases

## 2011-06-10 VITALS — BP 107/70 | HR 79 | Temp 97.8°F | Ht 75.0 in | Wt 223.0 lb

## 2011-06-10 DIAGNOSIS — N289 Disorder of kidney and ureter, unspecified: Secondary | ICD-10-CM

## 2011-06-10 DIAGNOSIS — R7309 Other abnormal glucose: Secondary | ICD-10-CM

## 2011-06-10 DIAGNOSIS — N189 Chronic kidney disease, unspecified: Secondary | ICD-10-CM

## 2011-06-10 DIAGNOSIS — B20 Human immunodeficiency virus [HIV] disease: Secondary | ICD-10-CM

## 2011-06-10 NOTE — Assessment & Plan Note (Signed)
Will continue to watch his Glc and A1C with lab visits.

## 2011-06-10 NOTE — Progress Notes (Signed)
  Subjective:    Patient ID: Lou Loewe, male    DOB: 30-Nov-1960, 51 y.o.   MRN: [id number]  HPI 51 yo M wiht hx of HIV/AIDS since 2004 when he was hospitalized with PCP, complicated by VDRF and CVA. He was started on KLT/CBV/TFV then switched to TRV/ATVr since 2007.  Was seen by nephrology and will watch his current art.  Feels well.  CD4 380 and VL <20, Cr 1.72, HgBA1C 5.6%, Chol 188 (05-28-11).     Review of Systems  Constitutional: Negative for fever and appetite change.  Gastrointestinal: Negative for diarrhea and constipation.  Genitourinary: Negative for dysuria.       Objective:   Physical Exam  Constitutional: He appears well-developed and well-nourished.  HENT:  Head: Normocephalic.  Mouth/Throat: No oropharyngeal exudate.  Eyes: EOM are normal. Pupils are equal, round, and reactive to light.  Neck: Neck supple.  Cardiovascular: Normal rate, regular rhythm and normal heart sounds.   Pulmonary/Chest: Effort normal and breath sounds normal.  Abdominal: Soft. Bowel sounds are normal. There is no tenderness.  Lymphadenopathy:    He has no cervical adenopathy.          Assessment & Plan:

## 2011-06-10 NOTE — Assessment & Plan Note (Signed)
He is doing well on his current ART. He declines condoms. vax are up to date. Will see him back in 6 months with labs prior.

## 2011-06-10 NOTE — Assessment & Plan Note (Signed)
Will cont to watch his Cr.

## 2011-07-24 ENCOUNTER — Telehealth: Payer: Self-pay | Admitting: *Deleted

## 2011-07-24 NOTE — Telephone Encounter (Signed)
Called patient and scheduled appointment for Monday 07/29/11 per Dr. Ninetta Lights to discuss his creatinine results. Wendall Mola CMA

## 2011-07-29 ENCOUNTER — Encounter: Payer: Self-pay | Admitting: Infectious Diseases

## 2011-07-29 ENCOUNTER — Ambulatory Visit (INDEPENDENT_AMBULATORY_CARE_PROVIDER_SITE_OTHER): Payer: Medicare Other | Admitting: Infectious Diseases

## 2011-07-29 DIAGNOSIS — B20 Human immunodeficiency virus [HIV] disease: Secondary | ICD-10-CM

## 2011-07-29 DIAGNOSIS — N189 Chronic kidney disease, unspecified: Secondary | ICD-10-CM

## 2011-07-29 LAB — COMPREHENSIVE METABOLIC PANEL
ALT: 26 U/L (ref 0–53)
CO2: 25 mEq/L (ref 19–32)
Calcium: 9.4 mg/dL (ref 8.4–10.5)
Chloride: 106 mEq/L (ref 96–112)
Creat: 1.7 mg/dL — ABNORMAL HIGH (ref 0.50–1.35)
Total Protein: 7.2 g/dL (ref 6.0–8.3)

## 2011-07-29 NOTE — Assessment & Plan Note (Addendum)
He is doing very well. He has an increased Bili, this is a function of the atazanavir. Not a true toxicity. 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 If he is +, may have to switch him to isentress instead.  He will call in 2 weeks for results.  O/w will see him back in 3 months.

## 2011-07-29 NOTE — Progress Notes (Signed)
  Subjective:    Patient ID: Mitchell Bird, male    DOB: [birth year], 51 y.o.   MRN: [id number]  HPI 51 yo M wiht hx of HIV/AIDS since 2004 when he was hospitalized with PCP, complicated by VDRF and CVA. He was started on KLT/CBV/TFV then switched to TRV/ATVr since 2007.  Was seen by nephrology and had Cr 1.72 (07-26-11).     Review of Systems  Respiratory: Negative for shortness of breath.   Cardiovascular: Negative for chest pain and leg swelling.       Objective:   Physical Exam  Constitutional: He appears well-developed and well-nourished.  Cardiovascular: Normal rate, regular rhythm and normal heart sounds.   Pulmonary/Chest: Effort normal and breath sounds normal.  Abdominal: Soft. Bowel sounds are normal. He exhibits no distension. There is no tenderness.  Musculoskeletal: He exhibits no edema.          Assessment & Plan:

## 2011-07-29 NOTE — Assessment & Plan Note (Signed)
Appears to be roughly the same. Will check his HILA-5701 to see if we can switch TFV regimen to Abacavir regimen.

## 2011-08-02 LAB — HLA B*5701

## 2011-08-11 ENCOUNTER — Other Ambulatory Visit: Payer: Self-pay | Admitting: Infectious Diseases

## 2011-08-11 DIAGNOSIS — B20 Human immunodeficiency virus [HIV] disease: Secondary | ICD-10-CM

## 2011-08-12 ENCOUNTER — Other Ambulatory Visit: Payer: Self-pay | Admitting: Infectious Diseases

## 2011-08-12 ENCOUNTER — Telehealth: Payer: Self-pay | Admitting: *Deleted

## 2011-08-12 DIAGNOSIS — B20 Human immunodeficiency virus [HIV] disease: Secondary | ICD-10-CM

## 2011-08-12 NOTE — Telephone Encounter (Signed)
Wanted to kno wif the lab results for the test that will help determine what med he shouls be taking is in yet. I told him I will check with md & call him tomorrow pm

## 2011-08-13 NOTE — Telephone Encounter (Signed)
I discussed with Dr. Ninetta Lights. Pt needs to come for appt to discuss meds. I called pt & then transferred him to [facility] to make an appt

## 2011-08-15 ENCOUNTER — Other Ambulatory Visit: Payer: Self-pay | Admitting: *Deleted

## 2011-08-15 DIAGNOSIS — B20 Human immunodeficiency virus [HIV] disease: Secondary | ICD-10-CM

## 2011-08-15 MED ORDER — ATAZANAVIR SULFATE 300 MG PO CAPS: 300.0000 mg | ORAL_CAPSULE | Freq: Every day | ORAL | Status: DC

## 2011-08-15 MED ORDER — GABAPENTIN 300 MG PO CAPS: 300.0000 mg | ORAL_CAPSULE | Freq: Three times a day (TID) | ORAL | Status: DC

## 2011-08-15 MED ORDER — EMTRICITABINE-TENOFOVIR DF 200-300 MG PO TABS: 1.0000 | ORAL_TABLET | Freq: Every day | ORAL | Status: DC

## 2011-08-15 MED ORDER — RITONAVIR 100 MG PO TABS: 100.0000 mg | ORAL_TABLET | Freq: Every day | ORAL | Status: DC

## 2011-08-21 ENCOUNTER — Ambulatory Visit (INDEPENDENT_AMBULATORY_CARE_PROVIDER_SITE_OTHER): Payer: Medicare Other | Admitting: Infectious Diseases

## 2011-08-21 ENCOUNTER — Encounter: Payer: Self-pay | Admitting: Infectious Diseases

## 2011-08-21 VITALS — Ht 73.0 in | Wt 227.4 lb

## 2011-08-21 DIAGNOSIS — B20 Human immunodeficiency virus [HIV] disease: Secondary | ICD-10-CM

## 2011-08-21 DIAGNOSIS — N189 Chronic kidney disease, unspecified: Secondary | ICD-10-CM

## 2011-08-21 MED ORDER — ABACAVIR SULFATE-LAMIVUDINE 600-300 MG PO TABS: 1.0000 | ORAL_TABLET | Freq: Every day | ORAL | Status: DC

## 2011-08-21 NOTE — Assessment & Plan Note (Signed)
He is doing very well. Will change his ART to try and improve his Cr. He has a negative HLA test making abacavir hypersensitivity reaction very unlikely. I asked him to call if he has any problems taking the new med, develops rash. Will see him back in 3 months and will recheck his Cr at that time.

## 2011-08-21 NOTE — Progress Notes (Signed)
  Subjective:    Patient ID: Mitchell Bird, male    DOB: 12-Jul-1960, 51 y.o.   MRN: [id number]  HPI 51 yo M wiht hx of HIV/AIDS since 2004 when he was hospitalized with PCP, complicated by VDRF and CVA. He was started on KLT/CBV/TFV then switched to TRV/ATVr since 2007.  Was seen by nephrology and had Cr 1.72 (07-26-11). 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   HIV 1 RNA Quant (copies/mL)  Date Value  05/28/2011 <20   01/30/2011 <20   07/09/2010 49*     CD4 T Cell Abs (cmm)  Date Value  05/28/2011 380*  01/30/2011 370*  07/09/2010 420       Review of Systems  Cardiovascular: Negative for leg swelling.  Genitourinary: Negative for dysuria.       Objective:   Physical Exam  Constitutional: He appears well-developed and well-nourished. No distress.          Assessment & Plan:

## 2011-08-21 NOTE — Assessment & Plan Note (Signed)
See HIV a/p. Hopefully will improve with new ART.

## 2011-11-27 ENCOUNTER — Other Ambulatory Visit: Payer: Medicare Other

## 2011-11-27 DIAGNOSIS — R7309 Other abnormal glucose: Secondary | ICD-10-CM

## 2011-11-27 DIAGNOSIS — B20 Human immunodeficiency virus [HIV] disease: Secondary | ICD-10-CM

## 2011-11-27 LAB — CBC
MCH: 36 pg — ABNORMAL HIGH (ref 26.0–34.0)
MCHC: 35.9 g/dL (ref 30.0–36.0)
Platelets: 138 10*3/uL — ABNORMAL LOW (ref 150–400)
RBC: 4.83 MIL/uL (ref 4.22–5.81)
RDW: 13.8 % (ref 11.5–15.5)

## 2011-11-27 LAB — COMPREHENSIVE METABOLIC PANEL
Alkaline Phosphatase: 122 U/L — ABNORMAL HIGH (ref 39–117)
Creat: 1.85 mg/dL — ABNORMAL HIGH (ref 0.50–1.35)
Glucose, Bld: 114 mg/dL — ABNORMAL HIGH (ref 70–99)
Sodium: 138 mEq/L (ref 135–145)
Total Bilirubin: 2.8 mg/dL — ABNORMAL HIGH (ref 0.3–1.2)
Total Protein: 7.1 g/dL (ref 6.0–8.3)

## 2011-11-27 LAB — HEMOGLOBIN A1C: Hgb A1c MFr Bld: 5.7 % — ABNORMAL HIGH (ref <5.7)

## 2011-11-28 LAB — HEPATITIS A ANTIBODY, TOTAL: Hep A Total Ab: NEGATIVE

## 2011-11-28 LAB — T-HELPER CELL (CD4) - (RCID CLINIC ONLY)
CD4 % Helper T Cell: 25 % — ABNORMAL LOW (ref 33–55)
CD4 T Cell Abs: 520 uL (ref 400–2700)

## 2011-11-29 LAB — HIV-1 RNA QUANT-NO REFLEX-BLD: HIV 1 RNA Quant: <20 copies/mL (ref <20)

## 2011-12-11 ENCOUNTER — Ambulatory Visit: Payer: Medicare Other | Admitting: Infectious Diseases

## 2011-12-16 ENCOUNTER — Encounter: Payer: Self-pay | Admitting: Infectious Diseases

## 2011-12-16 ENCOUNTER — Ambulatory Visit: Payer: Medicare Other | Admitting: Infectious Diseases

## 2011-12-16 ENCOUNTER — Ambulatory Visit (INDEPENDENT_AMBULATORY_CARE_PROVIDER_SITE_OTHER): Payer: Medicare Other | Admitting: Infectious Diseases

## 2011-12-16 VITALS — BP 101/70 | HR 86 | Temp 97.6°F | Ht 75.0 in | Wt 210.0 lb

## 2011-12-16 DIAGNOSIS — B20 Human immunodeficiency virus [HIV] disease: Secondary | ICD-10-CM

## 2011-12-16 DIAGNOSIS — Z79899 Other long term (current) drug therapy: Secondary | ICD-10-CM

## 2011-12-16 DIAGNOSIS — N189 Chronic kidney disease, unspecified: Secondary | ICD-10-CM

## 2011-12-16 DIAGNOSIS — Z113 Encounter for screening for infections with a predominantly sexual mode of transmission: Secondary | ICD-10-CM

## 2011-12-16 NOTE — Assessment & Plan Note (Signed)
His CrCl is 44. Will need to watch closely, he may not be able to continue FDC pills if this is the case. Atazanavir has also been associated with increased Cr.

## 2011-12-16 NOTE — Progress Notes (Signed)
  Subjective:    Patient ID: Mitchell Bird, male    DOB: July 20, 1960, 51 y.o.   MRN: [id number]  HPI 51 yo M with hx of HIV/AIDS since 2004 when he was hospitalized with PCP, complicated by VDRF and CVA. He was started on KLT/CBV/TFV then switched to TRV/ATVr since 2007.  Was seen by nephrology and had Cr 1.72 (07-26-11). 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  At last visit was switched to EPZ/ATVr. Cr now 1.85. CrCl 44 HIV 1 RNA Quant (copies/mL)  Date Value  11/27/2011 <20   05/28/2011 <20   01/30/2011 <20      CD4 T Cell Abs (cmm)  Date Value  11/27/2011 520   05/28/2011 380*  01/30/2011 370*   Had stomach virus last month but has since resolved. Has been doing well with new ART. Had mild GI upset first couple of weeks but has since resolved.    Review of Systems     Objective:   Physical Exam  Constitutional: He appears well-developed and well-nourished.  Eyes: EOM are normal. Pupils are equal, round, and reactive to light.  Neck: Neck supple.  Cardiovascular: Normal rate, regular rhythm and normal heart sounds.   Pulmonary/Chest: Effort normal and breath sounds normal.  Abdominal: Soft. Bowel sounds are normal. He exhibits no distension. There is no tenderness.  Musculoskeletal: He exhibits no edema.  Lymphadenopathy:    He has no cervical adenopathy.          Assessment & Plan:

## 2011-12-16 NOTE — Assessment & Plan Note (Signed)
He is doing very well. Unfortunately his Cr is still elevated. He is offered/refused condoms. Will see him back in January to recheck his labs. Need to start Hep A series, otherwise vaccines up to date.

## 2012-01-10 ENCOUNTER — Other Ambulatory Visit: Payer: Self-pay | Admitting: Infectious Diseases

## 2012-03-07 ENCOUNTER — Other Ambulatory Visit: Payer: Self-pay | Admitting: Infectious Diseases

## 2012-04-06 ENCOUNTER — Other Ambulatory Visit: Payer: Medicare Other

## 2012-04-06 ENCOUNTER — Ambulatory Visit: Payer: Medicare Other | Admitting: Infectious Diseases

## 2012-04-06 DIAGNOSIS — B20 Human immunodeficiency virus [HIV] disease: Secondary | ICD-10-CM

## 2012-04-06 DIAGNOSIS — Z113 Encounter for screening for infections with a predominantly sexual mode of transmission: Secondary | ICD-10-CM

## 2012-04-06 DIAGNOSIS — Z79899 Other long term (current) drug therapy: Secondary | ICD-10-CM

## 2012-04-06 LAB — COMPREHENSIVE METABOLIC PANEL
ALT: 15 U/L (ref 0–53)
Alkaline Phosphatase: 118 U/L — ABNORMAL HIGH (ref 39–117)
Creat: 1.68 mg/dL — ABNORMAL HIGH (ref 0.50–1.35)
Glucose, Bld: 87 mg/dL (ref 70–99)
Sodium: 138 mEq/L (ref 135–145)
Total Bilirubin: 6.5 mg/dL — ABNORMAL HIGH (ref 0.3–1.2)
Total Protein: 6.9 g/dL (ref 6.0–8.3)

## 2012-04-06 LAB — CBC
MCH: 35.9 pg — ABNORMAL HIGH (ref 26.0–34.0)
MCHC: 35.1 g/dL (ref 30.0–36.0)
MCV: 102.5 fL — ABNORMAL HIGH (ref 78.0–100.0)
Platelets: 135 10*3/uL — ABNORMAL LOW (ref 150–400)

## 2012-04-06 LAB — LIPID PANEL
LDL Cholesterol: 85 mg/dL (ref 0–99)
Total CHOL/HDL Ratio: 5.9 Ratio
Triglycerides: 256 mg/dL — ABNORMAL HIGH (ref <150)
VLDL: 51 mg/dL — ABNORMAL HIGH (ref 0–40)

## 2012-04-07 LAB — T-HELPER CELL (CD4) - (RCID CLINIC ONLY): CD4 % Helper T Cell: 26 % — ABNORMAL LOW (ref 33–55)

## 2012-04-08 LAB — HIV-1 RNA QUANT-NO REFLEX-BLD: HIV 1 RNA Quant: <20 copies/mL (ref <20)

## 2012-04-20 ENCOUNTER — Ambulatory Visit: Payer: Medicare Other | Admitting: Infectious Diseases

## 2012-04-27 ENCOUNTER — Encounter: Payer: Self-pay | Admitting: Infectious Diseases

## 2012-04-27 ENCOUNTER — Ambulatory Visit (INDEPENDENT_AMBULATORY_CARE_PROVIDER_SITE_OTHER): Payer: Medicare Other | Admitting: Infectious Diseases

## 2012-04-27 VITALS — BP 117/75 | HR 70 | Temp 98.0°F | Ht 75.0 in | Wt 214.0 lb

## 2012-04-27 DIAGNOSIS — E785 Hyperlipidemia, unspecified: Secondary | ICD-10-CM

## 2012-04-27 DIAGNOSIS — B20 Human immunodeficiency virus [HIV] disease: Secondary | ICD-10-CM

## 2012-04-27 DIAGNOSIS — Z23 Encounter for immunization: Secondary | ICD-10-CM

## 2012-04-27 DIAGNOSIS — F329 Major depressive disorder, single episode, unspecified: Secondary | ICD-10-CM

## 2012-04-27 DIAGNOSIS — G4700 Insomnia, unspecified: Secondary | ICD-10-CM

## 2012-04-27 DIAGNOSIS — Z79899 Other long term (current) drug therapy: Secondary | ICD-10-CM

## 2012-04-27 DIAGNOSIS — N189 Chronic kidney disease, unspecified: Secondary | ICD-10-CM

## 2012-04-27 DIAGNOSIS — Z113 Encounter for screening for infections with a predominantly sexual mode of transmission: Secondary | ICD-10-CM

## 2012-04-27 MED ORDER — RITONAVIR 100 MG PO TABS: 100.0000 mg | ORAL_TABLET | Freq: Every day | ORAL | Status: DC

## 2012-04-27 MED ORDER — ENSURE PO LIQD: 1.0000 | Freq: Two times a day (BID) | ORAL | Status: DC

## 2012-04-27 MED ORDER — GEMFIBROZIL 600 MG PO TABS: 600.0000 mg | ORAL_TABLET | Freq: Two times a day (BID) | ORAL | Status: DC

## 2012-04-27 MED ORDER — ZOLPIDEM TARTRATE 10 MG PO TABS: 10.0000 mg | ORAL_TABLET | Freq: Every evening | ORAL | Status: DC | PRN

## 2012-04-27 MED ORDER — GABAPENTIN 300 MG PO CAPS: 300.0000 mg | ORAL_CAPSULE | Freq: Three times a day (TID) | ORAL | Status: DC

## 2012-04-27 MED ORDER — ATAZANAVIR SULFATE 300 MG PO CAPS: 300.0000 mg | ORAL_CAPSULE | Freq: Every day | ORAL | Status: DC

## 2012-04-27 MED ORDER — QUETIAPINE FUMARATE 300 MG PO TABS: 750.0000 mg | ORAL_TABLET | Freq: Every day | ORAL | Status: DC

## 2012-04-27 MED ORDER — BUPROPION HCL ER (XL) 150 MG PO TB24: 150.0000 mg | ORAL_TABLET | Freq: Every day | ORAL | Status: DC

## 2012-04-27 MED ORDER — ABACAVIR SULFATE-LAMIVUDINE 600-300 MG PO TABS: 1.0000 | ORAL_TABLET | Freq: Every day | ORAL | Status: DC

## 2012-04-27 NOTE — Assessment & Plan Note (Signed)
Slightly improved, his CrCl does not warrant changing his meds at this point.

## 2012-04-27 NOTE — Assessment & Plan Note (Signed)
Doing very well. Will start hep A series. Needs colonoscopy. Discussed anal pap with him. Will see him back in 6 months with labs. Offered/refused condoms. Has gotten flu shot.

## 2012-04-27 NOTE — Assessment & Plan Note (Signed)
Will continue his meds, he appears to be doing well.

## 2012-04-27 NOTE — Progress Notes (Signed)
  Subjective:    Patient ID: Mitchell Bird, male    DOB: 1961/05/07, 51 y.o.   MRN: [id number]  HPI 51 yo M with hx of HIV/AIDS since 2004 when he was hospitalized with PCP, complicated by VDRF and CVA. He was started on KLT/CBV/TFV then switched to TRV/ATVr since 2007.  Has been seen for CRF by renal. 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  At previous visit was switched to EPZ/ATVr. Cr now 1.68. CrCl 71.4 (calculated).  No problems with ART.   HIV 1 RNA Quant (copies/mL)  Date Value  04/06/2012 <20   11/27/2011 <20   05/28/2011 <20      CD4 T Cell Abs (cmm)  Date Value  04/06/2012 720   11/27/2011 520   05/28/2011 380*      Review of Systems  Constitutional: Negative for appetite change and unexpected weight change.  Cardiovascular: Negative for leg swelling.  Gastrointestinal: Negative for diarrhea and constipation.  Genitourinary: Negative for difficulty urinating.       Objective:   Physical Exam  Constitutional: He appears well-developed and well-nourished.  HENT:  Mouth/Throat: No oropharyngeal exudate.  Eyes: EOM are normal. Pupils are equal, round, and reactive to light.  Neck: Neck supple.  Cardiovascular: Normal rate, regular rhythm and normal heart sounds.   Pulmonary/Chest: Effort normal and breath sounds normal.  Abdominal: Soft. Bowel sounds are normal. There is no tenderness. There is no rebound.  Musculoskeletal: He exhibits no edema.  Lymphadenopathy:    He has no cervical adenopathy.  Psychiatric: He has a normal mood and affect. His behavior is normal.          Assessment & Plan:

## 2012-04-27 NOTE — Addendum Note (Signed)
Addended by: Andree Coss on: 04/27/2012 04:16 PM   Modules accepted: Orders

## 2012-04-27 NOTE — Assessment & Plan Note (Signed)
Will start pt on lopid. Interaction checked through epocrates (none found).

## 2012-05-07 ENCOUNTER — Encounter: Payer: Self-pay | Admitting: Internal Medicine

## 2012-05-07 ENCOUNTER — Encounter: Payer: Self-pay | Admitting: *Deleted

## 2012-05-21 ENCOUNTER — Ambulatory Visit (INDEPENDENT_AMBULATORY_CARE_PROVIDER_SITE_OTHER): Payer: Medicare Other | Admitting: Internal Medicine

## 2012-05-21 ENCOUNTER — Telehealth: Payer: Self-pay | Admitting: Licensed Clinical Social Worker

## 2012-05-21 ENCOUNTER — Encounter: Payer: Self-pay | Admitting: Internal Medicine

## 2012-05-21 VITALS — BP 112/71 | HR 87 | Temp 98.9°F | Ht 75.0 in | Wt 215.0 lb

## 2012-05-21 DIAGNOSIS — J019 Acute sinusitis, unspecified: Secondary | ICD-10-CM

## 2012-05-21 MED ORDER — ACETAMINOPHEN-CODEINE #3 300-30 MG PO TABS: 1.0000 | ORAL_TABLET | ORAL | Status: DC | PRN

## 2012-05-21 MED ORDER — OXYMETAZOLINE HCL 0.05 % NA SOLN: 2.0000 | Freq: Two times a day (BID) | NASAL | Status: DC

## 2012-05-21 NOTE — Progress Notes (Signed)
Patient ID: Mitchell Bird, male   DOB: May 23, 1960, 52 y.o.   MRN: [id number]     [facility] for Infectious Disease  Patient Active Problem List  Diagnosis  . HIV DISEASE  . TOBACCO USER  . DEPRESSION  . STYE, INTERNAL  . ORTHOSTATIC HYPOTENSION  . Acute bronchitis  . DENTAL CARIES  . ORAL ULCER  . GERD  . COUGH  . DIARRHEA  . HYPERGLYCEMIA  . Chronic renal insufficiency  . Hyperlipidemia    Patient's Medications  New Prescriptions   ACETAMINOPHEN-CODEINE (TYLENOL #3) 300-30 MG PER TABLET    Take 1-2 tablets by mouth every 4 (four) hours as needed for pain.   OXYMETAZOLINE (AFRIN NASAL SPRAY) 0.05 % NASAL SPRAY    Place 2 sprays into the nose 2 (two) times daily.  Previous Medications   ABACAVIR-LAMIVUDINE (EPZICOM) 600-300 MG PER TABLET    Take 1 tablet by mouth daily.   ACETAMINOPHEN (TYLENOL) 325 MG TABLET    Take 650 mg by mouth every 6 (six) hours as needed.   ATAZANAVIR (REYATAZ) 300 MG CAPSULE    Take 1 capsule (300 mg total) by mouth daily with breakfast.   BUPROPION (WELLBUTRIN XL) 150 MG 24 HR TABLET    Take 1 tablet (150 mg total) by mouth daily.   DEXTROMETHORPHAN-GUAIFENESIN (MUCINEX DM) 30-600 MG PER 12 HR TABLET    Take 1 tablet by mouth every 12 (twelve) hours.   ENSURE (ENSURE)    Take 1 Can by mouth 2 (two) times daily between meals.   GABAPENTIN (NEURONTIN) 300 MG CAPSULE    Take 1 capsule (300 mg total) by mouth 3 (three) times daily.   GEMFIBROZIL (LOPID) 600 MG TABLET    Take 1 tablet (600 mg total) by mouth 2 (two) times daily before a meal.   QUETIAPINE (SEROQUEL) 300 MG TABLET    Take 2.5 tablets (750 mg total) by mouth at bedtime.   RITONAVIR (NORVIR) 100 MG TABS    Take 1 tablet (100 mg total) by mouth daily with breakfast.   ZOLPIDEM (AMBIEN) 10 MG TABLET    Take 1 tablet (10 mg total) by mouth at bedtime as needed.  Modified Medications   No medications on file  Discontinued Medications   No medications on file    Subjective: Mr. Mitchell Bird is  seen on a work in basis for cough that developed 6 days ago. It is dry and hacking associated with low grade fever and mild SOB. He has not had fever for 3 days but the cough persists. He also notes sinus congestion and chest and abdominal pain when coughing. He has no myalgias and received his flu vaccine 1 month ago. He has not had a cigarette in 3 days.  Objective: Temp: 98.9 F (37.2 C) (01/02 1409) Temp src: Oral (01/02 1409) BP: 112/71 mmHg (01/02 1409) Pulse Rate: 87  (01/02 1409)  General: loose but dry cough Skin: no rash Lungs: clear Cor: distant but reg S1 and S2 without murmurs  Lab Results HIV 1 RNA Quant (copies/mL)  Date Value  04/06/2012 <20   11/27/2011 <20   05/28/2011 <20      CD4 T Cell Abs (cmm)  Date Value  04/06/2012 720   11/27/2011 520   05/28/2011 380*     Assessment: He has acute bronchitis, probably viral. This does not sound like influenza.  Plan: 1. Afrin 2. Prn Tylenol # 3 for cough, pain and/or fever 3. Continue other meds 4. Cigarette cessation counselling  Cliffton Asters, MD [facility] for Infectious Disease [facility] [phone] pager   [phone] cell 05/21/2012, 2:36 PM

## 2012-05-21 NOTE — Telephone Encounter (Signed)
Patient called stating that he had sinus pain, fever of 101, and congestion for 2 days, he has been using mucinex with no relief. Patient was added to the schedule to see Dr. Orvan Falconer at 2:30pm today.

## 2012-05-21 NOTE — Addendum Note (Signed)
Addended by: Jennet Maduro D on: 05/21/2012 02:47 PM   Modules accepted: Orders

## 2012-05-22 ENCOUNTER — Telehealth: Payer: Self-pay | Admitting: *Deleted

## 2012-05-22 NOTE — Telephone Encounter (Signed)
Referral made to [facility] for colonoscopy on 06/17/12 at 9:30am. Letter sent notifying patient. Andree Coss, RN

## 2012-05-26 ENCOUNTER — Ambulatory Visit: Payer: Medicare Other | Admitting: Internal Medicine

## 2012-06-05 ENCOUNTER — Other Ambulatory Visit: Payer: Self-pay | Admitting: Infectious Diseases

## 2012-06-06 ENCOUNTER — Other Ambulatory Visit: Payer: Self-pay | Admitting: Infectious Diseases

## 2012-06-17 ENCOUNTER — Encounter: Payer: Medicare Other | Admitting: Internal Medicine

## 2012-07-24 ENCOUNTER — Other Ambulatory Visit: Payer: Self-pay | Admitting: Infectious Diseases

## 2012-10-13 ENCOUNTER — Other Ambulatory Visit: Payer: Medicare Other

## 2012-10-13 ENCOUNTER — Other Ambulatory Visit: Payer: Self-pay | Admitting: Infectious Disease

## 2012-10-13 DIAGNOSIS — B20 Human immunodeficiency virus [HIV] disease: Secondary | ICD-10-CM

## 2012-10-13 DIAGNOSIS — Z79899 Other long term (current) drug therapy: Secondary | ICD-10-CM

## 2012-10-13 LAB — COMPREHENSIVE METABOLIC PANEL
ALT: 10 U/L (ref 0–53)
Albumin: 4 g/dL (ref 3.5–5.2)
CO2: 25 mEq/L (ref 19–32)
Calcium: 9.8 mg/dL (ref 8.4–10.5)
Chloride: 105 mEq/L (ref 96–112)
Creat: 1.53 mg/dL — ABNORMAL HIGH (ref 0.50–1.35)
Potassium: 4 mEq/L (ref 3.5–5.3)
Sodium: 139 mEq/L (ref 135–145)
Total Protein: 6.9 g/dL (ref 6.0–8.3)

## 2012-10-13 LAB — CBC
MCV: 98.8 fL (ref 78.0–100.0)
Platelets: 147 10*3/uL — ABNORMAL LOW (ref 150–400)
RBC: 4.28 MIL/uL (ref 4.22–5.81)
WBC: 7.1 10*3/uL (ref 4.0–10.5)

## 2012-10-13 LAB — LIPID PANEL: Cholesterol: 183 mg/dL (ref 0–200)

## 2012-10-14 LAB — HIV-1 RNA QUANT-NO REFLEX-BLD
HIV 1 RNA Quant: <20 copies/mL (ref <20)
HIV-1 RNA Quant, Log: <1.3 {Log} (ref <1.30)

## 2012-10-14 LAB — T-HELPER CELL (CD4) - (RCID CLINIC ONLY): CD4 % Helper T Cell: 28 % — ABNORMAL LOW (ref 33–55)

## 2012-10-26 ENCOUNTER — Encounter: Payer: Self-pay | Admitting: Infectious Diseases

## 2012-10-26 ENCOUNTER — Ambulatory Visit (INDEPENDENT_AMBULATORY_CARE_PROVIDER_SITE_OTHER): Payer: Medicare Other | Admitting: Infectious Diseases

## 2012-10-26 VITALS — BP 104/68 | HR 83 | Temp 98.3°F | Ht 75.0 in | Wt 216.0 lb

## 2012-10-26 DIAGNOSIS — E785 Hyperlipidemia, unspecified: Secondary | ICD-10-CM

## 2012-10-26 DIAGNOSIS — B20 Human immunodeficiency virus [HIV] disease: Secondary | ICD-10-CM

## 2012-10-26 NOTE — Assessment & Plan Note (Signed)
He is doing very well. He is offered/refuses condoms. Will continue his current art. See him back in 6 months.

## 2012-10-26 NOTE — Assessment & Plan Note (Signed)
Continued mild improvement.

## 2012-10-26 NOTE — Assessment & Plan Note (Signed)
Moderate elevation of Trig. Will continue to watch.

## 2012-10-26 NOTE — Progress Notes (Signed)
  Subjective:    Patient ID: Mitchell Bird, male    DOB: [birth year], 52 y.o.   MRN: [id number]  HPI 52 yo M with hx of HIV/AIDS since 2004 when he was hospitalized with PCP, complicated by VDRF and CVA. He was started on KLT/CBV/TFV then switched to TRV/ATVr since 2007.  Has been seen for CRF by renal. 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 Was switched to EPZ/ATVr (08-21-11). No problem with ART.   HIV 1 RNA Quant (copies/mL)  Date Value  10/13/2012 <20   04/06/2012 <20   11/27/2011 <20      CD4 T Cell Abs (cmm)  Date Value  10/13/2012 660   04/06/2012 720   11/27/2011 520    Lab Results  Component Value Date   CHOL 183 10/13/2012   HDL 33* 10/13/2012   LDLCALC 103* 10/13/2012   TRIG 234* 10/13/2012   CHOLHDL 5.5 10/13/2012      Review of Systems  Constitutional: Negative for appetite change and unexpected weight change.  Gastrointestinal: Negative for diarrhea and constipation.  Genitourinary: Negative for difficulty urinating.       Objective:   Physical Exam  Constitutional: He appears well-developed and well-nourished.  HENT:  Mouth/Throat: No oropharyngeal exudate.  Eyes: EOM are normal. Pupils are equal, round, and reactive to light.  Neck: Neck supple.  Cardiovascular: Normal rate, regular rhythm and normal heart sounds.   Pulmonary/Chest: Effort normal and breath sounds normal.  Abdominal: Soft. Bowel sounds are normal. He exhibits no distension. There is no tenderness.  Lymphadenopathy:    He has no cervical adenopathy.          Assessment & Plan:

## 2013-01-15 ENCOUNTER — Other Ambulatory Visit: Payer: Self-pay | Admitting: *Deleted

## 2013-01-15 MED ORDER — GABAPENTIN 300 MG PO CAPS: ORAL_CAPSULE | ORAL | Status: DC

## 2013-03-25 ENCOUNTER — Other Ambulatory Visit: Payer: Self-pay

## 2013-05-05 ENCOUNTER — Other Ambulatory Visit: Payer: Medicare Other

## 2013-05-05 DIAGNOSIS — B20 Human immunodeficiency virus [HIV] disease: Secondary | ICD-10-CM

## 2013-05-05 LAB — COMPLETE METABOLIC PANEL WITH GFR
CO2: 25 mEq/L (ref 19–32)
Calcium: 9 mg/dL (ref 8.4–10.5)
Chloride: 104 mEq/L (ref 96–112)
Creat: 1.88 mg/dL — ABNORMAL HIGH (ref 0.50–1.35)
GFR, Est African American: 46 mL/min — ABNORMAL LOW
GFR, Est Non African American: 40 mL/min — ABNORMAL LOW
Glucose, Bld: 117 mg/dL — ABNORMAL HIGH (ref 70–99)
Total Bilirubin: 3.1 mg/dL — ABNORMAL HIGH (ref 0.3–1.2)

## 2013-05-05 LAB — CBC WITH DIFFERENTIAL/PLATELET
Basophils Relative: 1 % (ref 0–1)
Eosinophils Absolute: 0.2 10*3/uL (ref 0.0–0.7)
Hemoglobin: 16.3 g/dL (ref 13.0–17.0)
MCH: 35.4 pg — ABNORMAL HIGH (ref 26.0–34.0)
MCHC: 35.9 g/dL (ref 30.0–36.0)
Monocytes Relative: 13 % — ABNORMAL HIGH (ref 3–12)
Neutro Abs: 2.6 10*3/uL (ref 1.7–7.7)
Neutrophils Relative %: 47 % (ref 43–77)
Platelets: 99 10*3/uL — ABNORMAL LOW (ref 150–400)
RBC: 4.61 MIL/uL (ref 4.22–5.81)

## 2013-05-06 LAB — T-HELPER CELL (CD4) - (RCID CLINIC ONLY)
CD4 % Helper T Cell: 20 % — ABNORMAL LOW (ref 33–55)
CD4 T Cell Abs: 340 /uL — ABNORMAL LOW (ref 400–2700)

## 2013-05-19 ENCOUNTER — Ambulatory Visit: Payer: Medicare Other | Admitting: Infectious Diseases

## 2013-05-26 ENCOUNTER — Ambulatory Visit: Payer: Medicare Other | Admitting: Infectious Diseases

## 2013-05-28 ENCOUNTER — Ambulatory Visit (INDEPENDENT_AMBULATORY_CARE_PROVIDER_SITE_OTHER): Payer: Medicare Other | Admitting: Infectious Diseases

## 2013-05-28 ENCOUNTER — Encounter: Payer: Self-pay | Admitting: Infectious Diseases

## 2013-05-28 VITALS — BP 109/71 | HR 85 | Temp 97.8°F | Ht 75.0 in | Wt 218.0 lb

## 2013-05-28 DIAGNOSIS — N183 Chronic kidney disease, stage 3 unspecified: Secondary | ICD-10-CM

## 2013-05-28 DIAGNOSIS — Z23 Encounter for immunization: Secondary | ICD-10-CM

## 2013-05-28 DIAGNOSIS — N189 Chronic kidney disease, unspecified: Secondary | ICD-10-CM

## 2013-05-28 DIAGNOSIS — B20 Human immunodeficiency virus [HIV] disease: Secondary | ICD-10-CM

## 2013-05-28 DIAGNOSIS — R7309 Other abnormal glucose: Secondary | ICD-10-CM

## 2013-05-28 DIAGNOSIS — Z79899 Other long term (current) drug therapy: Secondary | ICD-10-CM

## 2013-05-28 DIAGNOSIS — N289 Disorder of kidney and ureter, unspecified: Secondary | ICD-10-CM

## 2013-05-28 DIAGNOSIS — N2889 Other specified disorders of kidney and ureter: Secondary | ICD-10-CM

## 2013-05-28 DIAGNOSIS — Z113 Encounter for screening for infections with a predominantly sexual mode of transmission: Secondary | ICD-10-CM

## 2013-05-28 MED ORDER — ATAZANAVIR SULFATE 300 MG PO CAPS: 300.0000 mg | ORAL_CAPSULE | Freq: Every day | ORAL | Status: DC

## 2013-05-28 MED ORDER — ENSURE PO LIQD
1.0000 | Freq: Two times a day (BID) | ORAL | Status: DC
Start: 2013-05-28 — End: 2014-02-09

## 2013-05-28 MED ORDER — RITONAVIR 100 MG PO TABS: 100.0000 mg | ORAL_TABLET | Freq: Every day | ORAL | Status: DC

## 2013-05-28 MED ORDER — ABACAVIR SULFATE-LAMIVUDINE 600-300 MG PO TABS: 1.0000 | ORAL_TABLET | Freq: Every day | ORAL | Status: DC

## 2013-05-28 NOTE — Assessment & Plan Note (Signed)
Will continue his current lipid lowering agent. Watch LFTs.

## 2013-05-28 NOTE — Assessment & Plan Note (Signed)
His Cr has mildly increased. Will continue to watch.

## 2013-05-28 NOTE — Assessment & Plan Note (Signed)
His Cd4 has come down but his VL is still undetectable. My biggest concern is continued use of FDC with his increasing Cr. He is offered/refuses condoms. Will see him back in 6 months. vax are up to date.   Will get him seen for colonoscopy

## 2013-05-28 NOTE — Progress Notes (Signed)
   Subjective:    Patient ID: Mitchell Bird, male    DOB: [birth year], 53 y.o.   MRN: [id number]  HPI 53 yo M with hx of HIV/AIDS since 2004 when he was hospitalized with PCP, complicated by VDRF and CVA. He was started on KLT/CBV/TFV then switched to TRV/ATVr 2007.  Has been seen for CRF by renal. 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 Was switched to EPZ/ATVr (08-21-11). Last Cr 1.88 (previous 1.5).    HIV 1 RNA Quant (copies/mL)  Date Value  05/05/2013 <20   10/13/2012 <20   04/06/2012 <20      CD4 T Cell Abs (/uL)  Date Value  05/05/2013 340*  10/13/2012 660   04/06/2012 720    Lab Results  Component Value Date   CHOL 183 10/13/2012   HDL 33* 10/13/2012   LDLCALC 103* 10/13/2012   TRIG 234* 10/13/2012   CHOLHDL 5.5 10/13/2012     Review of Systems  Constitutional: Negative for fever, chills, appetite change and unexpected weight change.  Respiratory: Positive for cough.   Gastrointestinal: Negative for diarrhea and constipation.  Genitourinary: Negative for difficulty urinating.       Objective:   Physical Exam  Constitutional: He appears well-developed and well-nourished.  HENT:  Mouth/Throat: No oropharyngeal exudate.  Eyes: EOM are normal. Pupils are equal, round, and reactive to light.  Neck: Neck supple.  Cardiovascular: Normal rate, regular rhythm and normal heart sounds.   Pulmonary/Chest: Effort normal and breath sounds normal.  Abdominal: Soft. Bowel sounds are normal. He exhibits no distension. There is no tenderness.  Lymphadenopathy:    He has no cervical adenopathy.          Assessment & Plan:

## 2013-11-22 ENCOUNTER — Other Ambulatory Visit: Payer: Medicare Other

## 2013-11-22 DIAGNOSIS — R7309 Other abnormal glucose: Secondary | ICD-10-CM

## 2013-11-22 DIAGNOSIS — B20 Human immunodeficiency virus [HIV] disease: Secondary | ICD-10-CM

## 2013-11-22 DIAGNOSIS — Z79899 Other long term (current) drug therapy: Secondary | ICD-10-CM

## 2013-11-22 DIAGNOSIS — Z113 Encounter for screening for infections with a predominantly sexual mode of transmission: Secondary | ICD-10-CM

## 2013-11-23 LAB — COMPLETE METABOLIC PANEL WITH GFR
ALBUMIN: 4.4 g/dL (ref 3.5–5.2)
ALK PHOS: 113 U/L (ref 39–117)
ALT: 18 U/L (ref 0–53)
AST: 15 U/L (ref 0–37)
BILIRUBIN TOTAL: 4.4 mg/dL — AB (ref 0.2–1.2)
BUN: 9 mg/dL (ref 6–23)
CO2: 26 mEq/L (ref 19–32)
Calcium: 9.7 mg/dL (ref 8.4–10.5)
Chloride: 104 mEq/L (ref 96–112)
Creat: 1.42 mg/dL — ABNORMAL HIGH (ref 0.50–1.35)
GFR, Est African American: 65 mL/min
GFR, Est Non African American: 56 mL/min — ABNORMAL LOW
Glucose, Bld: 78 mg/dL (ref 70–99)
POTASSIUM: 4.2 meq/L (ref 3.5–5.3)
Sodium: 142 mEq/L (ref 135–145)
TOTAL PROTEIN: 7.2 g/dL (ref 6.0–8.3)

## 2013-11-23 LAB — CBC WITH DIFFERENTIAL/PLATELET
Basophils Absolute: 0.1 10*3/uL (ref 0.0–0.1)
Basophils Relative: 1 % (ref 0–1)
EOS ABS: 0.1 10*3/uL (ref 0.0–0.7)
Eosinophils Relative: 2 % (ref 0–5)
HCT: 44.9 % (ref 39.0–52.0)
Hemoglobin: 15.7 g/dL (ref 13.0–17.0)
Lymphocytes Relative: 31 % (ref 12–46)
Lymphs Abs: 2.1 10*3/uL (ref 0.7–4.0)
MCH: 34.9 pg — ABNORMAL HIGH (ref 26.0–34.0)
MCHC: 35 g/dL (ref 30.0–36.0)
MCV: 99.8 fL (ref 78.0–100.0)
Monocytes Absolute: 0.4 10*3/uL (ref 0.1–1.0)
Monocytes Relative: 6 % (ref 3–12)
NEUTROS ABS: 4 10*3/uL (ref 1.7–7.7)
NEUTROS PCT: 60 % (ref 43–77)
PLATELETS: 118 10*3/uL — AB (ref 150–400)
RBC: 4.5 MIL/uL (ref 4.22–5.81)
RDW: 13.8 % (ref 11.5–15.5)
WBC: 6.7 10*3/uL (ref 4.0–10.5)

## 2013-11-23 LAB — RPR

## 2013-11-23 LAB — LIPID PANEL
CHOLESTEROL: 155 mg/dL (ref 0–200)
HDL: 37 mg/dL — AB (ref >39)
LDL CALC: 87 mg/dL (ref 0–99)
Total CHOL/HDL Ratio: 4.2 Ratio
Triglycerides: 155 mg/dL — ABNORMAL HIGH (ref <150)
VLDL: 31 mg/dL (ref 0–40)

## 2013-11-23 LAB — HEPATITIS B SURFACE ANTIBODY,QUALITATIVE: Hep B S Ab: NEGATIVE

## 2013-11-23 LAB — T-HELPER CELLS (CD4) COUNT (NOT AT ARMC)
CD4 T CELL HELPER: 27 % — AB (ref 33–55)
CD4 T Cell Abs: 550 /uL (ref 400–2700)

## 2013-11-23 LAB — HIV-1 RNA QUANT-NO REFLEX-BLD
HIV 1 RNA Quant: <20 copies/mL (ref <20)
HIV-1 RNA Quant, Log: <1.3 {Log} (ref <1.30)

## 2013-12-06 ENCOUNTER — Ambulatory Visit: Payer: Medicare Other | Admitting: Infectious Diseases

## 2014-01-06 ENCOUNTER — Ambulatory Visit: Payer: Medicare Other | Admitting: Infectious Diseases

## 2014-01-10 ENCOUNTER — Ambulatory Visit: Payer: Medicare Other | Admitting: Infectious Diseases

## 2014-01-17 ENCOUNTER — Ambulatory Visit: Payer: Medicare Other | Admitting: Infectious Diseases

## 2014-01-17 ENCOUNTER — Telehealth: Payer: Self-pay | Admitting: *Deleted

## 2014-01-17 NOTE — Telephone Encounter (Signed)
Left message informing patient of missed appointment (he requested the work-in appointment for "pain").  Notified patient of his upcoming regularly scheduled appointment 02/10/12.  Andree Coss, RN

## 2014-01-18 DIAGNOSIS — Z9049 Acquired absence of other specified parts of digestive tract: Secondary | ICD-10-CM | POA: Insufficient documentation

## 2014-02-09 ENCOUNTER — Encounter: Payer: Self-pay | Admitting: Infectious Diseases

## 2014-02-09 ENCOUNTER — Ambulatory Visit (INDEPENDENT_AMBULATORY_CARE_PROVIDER_SITE_OTHER): Payer: Medicare Other | Admitting: Infectious Diseases

## 2014-02-09 VITALS — BP 92/63 | HR 77 | Temp 97.4°F | Ht 75.0 in | Wt 202.0 lb

## 2014-02-09 DIAGNOSIS — Z9089 Acquired absence of other organs: Secondary | ICD-10-CM

## 2014-02-09 DIAGNOSIS — Z23 Encounter for immunization: Secondary | ICD-10-CM

## 2014-02-09 DIAGNOSIS — B20 Human immunodeficiency virus [HIV] disease: Secondary | ICD-10-CM

## 2014-02-09 DIAGNOSIS — Z113 Encounter for screening for infections with a predominantly sexual mode of transmission: Secondary | ICD-10-CM

## 2014-02-09 DIAGNOSIS — Z9049 Acquired absence of other specified parts of digestive tract: Secondary | ICD-10-CM

## 2014-02-09 DIAGNOSIS — Z79899 Other long term (current) drug therapy: Secondary | ICD-10-CM

## 2014-02-09 NOTE — Progress Notes (Signed)
   Subjective:    Patient ID: Mitchell Bird, male    DOB: Dec 23, 1960, 53 y.o.   MRN: [id number]  HPI 53 yo M with hx of HIV/AIDS since 2004 when he was hospitalized with PCP, complicated by VDRF and CVA. He was started on KLT/CBV/TFV then switched to TRV/ATVr 2007.  Has been seen for CRF by renal. 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 Was switched to EPZ/ATVr (08-21-11). Had cholecystectomy 2 weeks ago at [facility] (01-18-14). Lost 10# as a result of this. Since has felt well. No probs with ART since.  Resuming Hep B series today.   HIV 1 RNA Quant (copies/mL)  Date Value  11/22/2013 <20   05/05/2013 <20   10/13/2012 <20      CD4 T Cell Abs (/uL)  Date Value  11/22/2013 550   05/05/2013 340*  10/13/2012 660      Review of Systems  Constitutional: Positive for unexpected weight change. Negative for appetite change.  Gastrointestinal: Negative for diarrhea and constipation.  Genitourinary: Negative for difficulty urinating.       Objective:   Physical Exam  Constitutional: He appears well-developed and well-nourished.  HENT:  Mouth/Throat: No oropharyngeal exudate.  Eyes: EOM are normal. Pupils are equal, round, and reactive to light.  Neck: Neck supple.  Cardiovascular: Normal rate, regular rhythm and normal heart sounds.   Pulmonary/Chest: Effort normal and breath sounds normal.  Abdominal: Soft. Bowel sounds are normal. He exhibits no distension. There is no tenderness.  Lymphadenopathy:    He has no cervical adenopathy.          Assessment & Plan:

## 2014-02-09 NOTE — Assessment & Plan Note (Signed)
He is doing very well. Gets flu and Hep B restart today. Offered/refused condoms. Will see him back in 6 months.  Asks for ensure, i let him know he is over BMI threshold.

## 2014-03-11 ENCOUNTER — Ambulatory Visit (INDEPENDENT_AMBULATORY_CARE_PROVIDER_SITE_OTHER): Payer: Medicare Other | Admitting: *Deleted

## 2014-03-11 DIAGNOSIS — B20 Human immunodeficiency virus [HIV] disease: Secondary | ICD-10-CM

## 2014-03-11 DIAGNOSIS — Z23 Encounter for immunization: Secondary | ICD-10-CM

## 2014-06-08 ENCOUNTER — Other Ambulatory Visit: Payer: Self-pay | Admitting: Infectious Diseases

## 2014-08-01 ENCOUNTER — Encounter: Payer: Self-pay | Admitting: Infectious Diseases

## 2014-08-01 ENCOUNTER — Ambulatory Visit (INDEPENDENT_AMBULATORY_CARE_PROVIDER_SITE_OTHER): Payer: Medicare Other | Admitting: Infectious Diseases

## 2014-08-01 VITALS — BP 101/68 | HR 66 | Temp 97.8°F | Ht 75.0 in | Wt 193.0 lb

## 2014-08-01 DIAGNOSIS — Z23 Encounter for immunization: Secondary | ICD-10-CM | POA: Diagnosis not present

## 2014-08-01 DIAGNOSIS — B20 Human immunodeficiency virus [HIV] disease: Secondary | ICD-10-CM

## 2014-08-01 DIAGNOSIS — Z113 Encounter for screening for infections with a predominantly sexual mode of transmission: Secondary | ICD-10-CM

## 2014-08-01 DIAGNOSIS — Z79899 Other long term (current) drug therapy: Secondary | ICD-10-CM | POA: Diagnosis not present

## 2014-08-01 LAB — COMPREHENSIVE METABOLIC PANEL
ALBUMIN: 4 g/dL (ref 3.5–5.2)
ALK PHOS: 129 U/L — AB (ref 39–117)
ALT: 11 U/L (ref 0–53)
AST: 10 U/L (ref 0–37)
BILIRUBIN TOTAL: 3.7 mg/dL — AB (ref 0.2–1.2)
BUN: 12 mg/dL (ref 6–23)
CO2: 29 mEq/L (ref 19–32)
CREATININE: 1.51 mg/dL — AB (ref 0.50–1.35)
Calcium: 10 mg/dL (ref 8.4–10.5)
Chloride: 104 mEq/L (ref 96–112)
GLUCOSE: 93 mg/dL (ref 70–99)
Potassium: 4.4 mEq/L (ref 3.5–5.3)
Sodium: 140 mEq/L (ref 135–145)
Total Protein: 6.9 g/dL (ref 6.0–8.3)

## 2014-08-01 LAB — CBC
HCT: 48 % (ref 39.0–52.0)
Hemoglobin: 16.6 g/dL (ref 13.0–17.0)
MCH: 35.6 pg — AB (ref 26.0–34.0)
MCHC: 34.6 g/dL (ref 30.0–36.0)
MCV: 103 fL — ABNORMAL HIGH (ref 78.0–100.0)
MPV: 10.6 fL (ref 8.6–12.4)
PLATELETS: 139 10*3/uL — AB (ref 150–400)
RBC: 4.66 MIL/uL (ref 4.22–5.81)
RDW: 13.4 % (ref 11.5–15.5)
WBC: 6 10*3/uL (ref 4.0–10.5)

## 2014-08-01 LAB — LIPID PANEL
CHOLESTEROL: 121 mg/dL (ref 0–200)
HDL: 42 mg/dL (ref >=40)
LDL CALC: 58 mg/dL (ref 0–99)
Total CHOL/HDL Ratio: 2.9 Ratio
Triglycerides: 107 mg/dL (ref <150)
VLDL: 21 mg/dL (ref 0–40)

## 2014-08-01 LAB — RPR

## 2014-08-01 NOTE — Addendum Note (Signed)
Addended by: Andree CossHOWELL, [place] Stenberg M on: 08/01/2014 05:34 PM   Modules accepted: Orders

## 2014-08-01 NOTE — Assessment & Plan Note (Signed)
Doing very well.  Getting hep b #3 Encouraged him to get colonoscopy. Will do this summer.  Will see him back in 6 months.

## 2014-08-01 NOTE — Progress Notes (Signed)
   Subjective:    Patient ID: Mitchell Bird, male    DOB: 24-Mar-1961, 54 y.o.   MRN: [id number]  HPI 54 yo M with hx of HIV/AIDS since 2004 when he was hospitalized with PCP, complicated by VDRF and CVA. He was started on KLT/CBV/TFV then switched to TRV/ATVr 2007.  Has been seen for CRF by renal. 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 Was switched to EPZ/ATVr (08-21-11).  Has beenwell, apetite has been off, ? Reason. Has been since cholecystectomy. Has lost 20#.  No missed meds.   HIV 1 RNA QUANT (copies/mL)  Date Value  11/22/2013 <20  05/05/2013 <20  10/13/2012 <20   CD4 T CELL ABS  Date Value  11/22/2013 550 /uL  05/05/2013 340 /uL*  10/13/2012 660 cmm    Review of Systems  Constitutional: Positive for appetite change and unexpected weight change.  Gastrointestinal: Negative for diarrhea and constipation.  Genitourinary: Negative for difficulty urinating.       Objective:   Physical Exam  Constitutional: He appears well-developed and well-nourished.  HENT:  Mouth/Throat: No oropharyngeal exudate.  Eyes: EOM are normal. Pupils are equal, round, and reactive to light.  Neck: Neck supple.  Cardiovascular: Normal rate, regular rhythm and normal heart sounds.   Pulmonary/Chest: Effort normal and breath sounds normal.  Abdominal: Soft. Bowel sounds are normal. There is no tenderness. There is no rebound.  Lymphadenopathy:    He has no cervical adenopathy.      Assessment & Plan:

## 2014-08-02 LAB — HIV-1 RNA QUANT-NO REFLEX-BLD: HIV 1 RNA Quant: <20 copies/mL (ref <20)

## 2014-08-02 LAB — T-HELPER CELL (CD4) - (RCID CLINIC ONLY)
CD4 % Helper T Cell: 30 % — ABNORMAL LOW (ref 33–55)
CD4 T CELL ABS: 550 /uL (ref 400–2700)

## 2014-08-10 ENCOUNTER — Ambulatory Visit: Payer: Medicare Other

## 2014-09-01 ENCOUNTER — Other Ambulatory Visit: Payer: Self-pay | Admitting: Infectious Diseases

## 2014-09-22 ENCOUNTER — Other Ambulatory Visit: Payer: Self-pay | Admitting: Infectious Diseases

## 2014-12-03 ENCOUNTER — Other Ambulatory Visit: Payer: Self-pay | Admitting: Infectious Diseases

## 2014-12-03 DIAGNOSIS — B20 Human immunodeficiency virus [HIV] disease: Secondary | ICD-10-CM

## 2014-12-19 ENCOUNTER — Other Ambulatory Visit: Payer: Self-pay | Admitting: Infectious Disease

## 2015-02-07 ENCOUNTER — Other Ambulatory Visit: Payer: Medicare Other

## 2015-02-07 ENCOUNTER — Other Ambulatory Visit (HOSPITAL_COMMUNITY)
Admission: RE | Admit: 2015-02-07 | Discharge: 2015-02-07 | Disposition: A | Discharge status: Home / Self Care | Payer: Medicare Other | Source: Ambulatory Visit | Attending: Internal Medicine | Admitting: Internal Medicine

## 2015-02-07 DIAGNOSIS — Z113 Encounter for screening for infections with a predominantly sexual mode of transmission: Secondary | ICD-10-CM

## 2015-02-07 DIAGNOSIS — Z79899 Other long term (current) drug therapy: Secondary | ICD-10-CM

## 2015-02-07 DIAGNOSIS — B20 Human immunodeficiency virus [HIV] disease: Secondary | ICD-10-CM

## 2015-02-07 LAB — CBC
HCT: 48.2 % (ref 39.0–52.0)
HEMOGLOBIN: 16.6 g/dL (ref 13.0–17.0)
MCH: 34.9 pg — ABNORMAL HIGH (ref 26.0–34.0)
MCHC: 34.4 g/dL (ref 30.0–36.0)
MCV: 101.5 fL — ABNORMAL HIGH (ref 78.0–100.0)
MPV: 10.5 fL (ref 8.6–12.4)
Platelets: 122 10*3/uL — ABNORMAL LOW (ref 150–400)
RBC: 4.75 MIL/uL (ref 4.22–5.81)
RDW: 13.8 % (ref 11.5–15.5)
WBC: 5.7 10*3/uL (ref 4.0–10.5)

## 2015-02-07 NOTE — Addendum Note (Signed)
Addended by: Alesia Morin F on: 02/07/2015 11:30 AM   Modules accepted: Orders

## 2015-02-08 LAB — LIPID PANEL
Cholesterol: 132 mg/dL (ref 125–200)
HDL: 44 mg/dL (ref >=40)
LDL CALC: 63 mg/dL (ref <130)
TRIGLYCERIDES: 125 mg/dL (ref <150)
Total CHOL/HDL Ratio: 3 Ratio (ref <=5.0)
VLDL: 25 mg/dL (ref <30)

## 2015-02-08 LAB — COMPREHENSIVE METABOLIC PANEL
ALBUMIN: 4.4 g/dL (ref 3.6–5.1)
ALT: 13 U/L (ref 9–46)
AST: 11 U/L (ref 10–35)
Alkaline Phosphatase: 97 U/L (ref 40–115)
BILIRUBIN TOTAL: 3.3 mg/dL — AB (ref 0.2–1.2)
BUN: 10 mg/dL (ref 7–25)
CO2: 27 mmol/L (ref 20–31)
Calcium: 10.2 mg/dL (ref 8.6–10.3)
Chloride: 103 mmol/L (ref 98–110)
Creat: 1.53 mg/dL — ABNORMAL HIGH (ref 0.70–1.33)
GLUCOSE: 125 mg/dL — AB (ref 65–99)
Potassium: 4.9 mmol/L (ref 3.5–5.3)
SODIUM: 141 mmol/L (ref 135–146)
Total Protein: 7.1 g/dL (ref 6.1–8.1)

## 2015-02-08 LAB — T-HELPER CELL (CD4) - (RCID CLINIC ONLY)
CD4 % Helper T Cell: 25 % — ABNORMAL LOW (ref 33–55)
CD4 T Cell Abs: 470 /uL (ref 400–2700)

## 2015-02-08 LAB — URINE CYTOLOGY ANCILLARY ONLY
Chlamydia: NEGATIVE
Neisseria Gonorrhea: NEGATIVE

## 2015-02-08 LAB — HIV-1 RNA QUANT-NO REFLEX-BLD: HIV 1 RNA Quant: <20 copies/mL (ref <20)

## 2015-02-08 LAB — RPR

## 2015-02-22 ENCOUNTER — Ambulatory Visit (INDEPENDENT_AMBULATORY_CARE_PROVIDER_SITE_OTHER): Payer: Medicare Other | Admitting: Infectious Diseases

## 2015-02-22 ENCOUNTER — Encounter: Payer: Self-pay | Admitting: Infectious Diseases

## 2015-02-22 VITALS — BP 117/77 | HR 66 | Temp 97.7°F | Ht 75.0 in | Wt 190.0 lb

## 2015-02-22 DIAGNOSIS — F17200 Nicotine dependence, unspecified, uncomplicated: Secondary | ICD-10-CM | POA: Diagnosis not present

## 2015-02-22 DIAGNOSIS — N189 Chronic kidney disease, unspecified: Secondary | ICD-10-CM | POA: Diagnosis not present

## 2015-02-22 DIAGNOSIS — Z0000 Encounter for general adult medical examination without abnormal findings: Secondary | ICD-10-CM | POA: Diagnosis not present

## 2015-02-22 DIAGNOSIS — N183 Chronic kidney disease, stage 3 unspecified: Secondary | ICD-10-CM

## 2015-02-22 DIAGNOSIS — Z79899 Other long term (current) drug therapy: Secondary | ICD-10-CM

## 2015-02-22 DIAGNOSIS — G4700 Insomnia, unspecified: Secondary | ICD-10-CM | POA: Diagnosis not present

## 2015-02-22 DIAGNOSIS — Z113 Encounter for screening for infections with a predominantly sexual mode of transmission: Secondary | ICD-10-CM

## 2015-02-22 DIAGNOSIS — Z23 Encounter for immunization: Secondary | ICD-10-CM

## 2015-02-22 DIAGNOSIS — B20 Human immunodeficiency virus [HIV] disease: Secondary | ICD-10-CM

## 2015-02-22 NOTE — Assessment & Plan Note (Signed)
Encouraged to quit. 

## 2015-02-22 NOTE — Progress Notes (Signed)
   Subjective:    Patient ID: Mitchell Bird, male    DOB: Oct 31, 1960, 54 y.o.   MRN: [id number]  HPI  54 yo M with hx of HIV/AIDS since 2004 when he was hospitalized with PCP, complicated by VDRF and CVA. He was started on KLT/CBV/TFV then switched to TRV/ATVr 2007.  Has been seen for CRF by renal. 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 Was switched to EPZ/ATVr (08-21-11).  Feels like his PTSD is worsening- his PCP has adjusted his sleep rx's without minimal benefit (trazadone, ambien). Getting 3-4 hours of sleep/night, broken sleep. Worried that he will not wake up when he lays down to go to sleep.  No problems with ART, no missed doses.   HIV 1 RNA QUANT (copies/mL)  Date Value  02/07/2015 <20  08/01/2014 <20  11/22/2013 <20   CD4 T CELL ABS (/uL)  Date Value  02/07/2015 470  08/01/2014 550  11/22/2013 550   Review of Systems  Constitutional: Negative for appetite change and unexpected weight change.  Respiratory: Negative for cough and shortness of breath.   Gastrointestinal: Negative for diarrhea and constipation.  Genitourinary: Negative for difficulty urinating.  Psychiatric/Behavioral: Positive for sleep disturbance. The patient is not nervous/anxious.    Has not had colon yet.     Objective:   Physical Exam  Constitutional: He appears well-developed and well-nourished.  HENT:  Mouth/Throat: No oropharyngeal exudate.  Eyes: EOM are normal. Pupils are equal, round, and reactive to light.  Neck: Neck supple.  Cardiovascular: Normal rate, regular rhythm and normal heart sounds.   Pulmonary/Chest: Effort normal and breath sounds normal.  Abdominal: Soft. Bowel sounds are normal. There is no tenderness. There is no rebound.  Lymphadenopathy:    He has no cervical adenopathy.       Assessment & Plan:

## 2015-02-22 NOTE — Assessment & Plan Note (Signed)
Cr is staying stable.  Will continue to monitor

## 2015-02-22 NOTE — Assessment & Plan Note (Signed)
We discussed sleep hygiene.  This is part of a larger issue of PTSD related to his Dx.  He is meeting with our counslor.

## 2015-02-22 NOTE — Assessment & Plan Note (Signed)
Will have him eval for colonoscopy

## 2015-02-22 NOTE — Progress Notes (Unsigned)
Patient ID: Mitchell Bird, male   DOB: Feb 14, 1961, 54 y.o.   MRN: [id number] I met with Mitchell Bird briefly today to discuss possible treatment for his PTSD.  I explained about EMDR and he seemed very interested.  Gave him a business card and suggested he schedule at the front desk. Curley Spice, LCSW

## 2015-02-22 NOTE — Assessment & Plan Note (Signed)
He is doing well on his current ART.  Will give him flu shot today. Offered/refused condoms.    Will rtc in 6 monmths

## 2015-02-22 NOTE — Addendum Note (Signed)
Addended by: Janus Vlcek C on: 02/22/2015 03:52 PM   Modules accepted: Orders

## 2015-03-18 ENCOUNTER — Other Ambulatory Visit: Payer: Self-pay | Admitting: Infectious Diseases

## 2015-03-18 DIAGNOSIS — B20 Human immunodeficiency virus [HIV] disease: Secondary | ICD-10-CM

## 2015-03-20 ENCOUNTER — Ambulatory Visit: Payer: Medicare Other

## 2015-03-20 DIAGNOSIS — F4310 Post-traumatic stress disorder, unspecified: Secondary | ICD-10-CM

## 2015-03-20 DIAGNOSIS — F331 Major depressive disorder, recurrent, moderate: Secondary | ICD-10-CM

## 2015-03-20 NOTE — BH Specialist Note (Signed)
I met with Mitchell Bird for the first time (except for a brief meeting in an exam room) and he reports multiple symptoms of depression and PTSD, including crying spells, depressed mood (5 on a 10 pt scale), poor sleep, poor concentration, isolation, low motivation, loss of interest, low energy, racing thoughts, nightmares, daily flashbacks, emotional detachment, and exaggerated startle response.  We discussed the diagnosis of PTSD as well as major depressive disorder.  I discussed how EMDR works and he expressed a strong interest in this treatment.  His trauma has to do with going to the hospital after being diagnosed HIV and going into a coma, waking up a month later, having had a stroke.  He says he worries about this every night when he tries to go to sleep, worrying that he may not wake up.  I also provided psycho-education on mindfulness.  Plan to meet again in one week. Curley Spice, LCSW

## 2015-03-29 ENCOUNTER — Ambulatory Visit: Payer: Medicare Other

## 2015-03-29 DIAGNOSIS — F4310 Post-traumatic stress disorder, unspecified: Secondary | ICD-10-CM

## 2015-03-29 NOTE — BH Specialist Note (Signed)
Mitchell Bird reported that things are pretty much the same for him.  He continues to struggle with undisturbed sleep, dreading going to sleep, then waking up every 1 & 1/2 to 2 hours.  I provided further psycho-education on EMDR.  We also completed a treatment plan with goals of reducing anxiety and improving mood.  Then, I facilitated a guided relaxation exercise as well as the "container" and "safe place" exercises, which he responded well to and said he liked.  Plan to meet in 2 weeks and may do EMDR then. [name], LCSW

## 2015-04-12 ENCOUNTER — Ambulatory Visit: Payer: Medicare Other

## 2015-04-17 ENCOUNTER — Ambulatory Visit: Payer: Medicare Other

## 2015-04-17 DIAGNOSIS — F4310 Post-traumatic stress disorder, unspecified: Secondary | ICD-10-CM

## 2015-04-17 NOTE — BH Specialist Note (Signed)
Mitchell Bird was in fairly good spirits today, reporting that he had a quiet Thanksgiving.  He agreed to give EMDR a try, and said he had been having some increased anxiety.  I suggested that it may have been in anticipation of the EMDR and he agreed that it probably was.  He participated well in it, getting absorbed in the experience and reporting being at an "8" on a 10 point subjective unit of distress scale (SUD).  He returned to the moment when he was being wheeled onto an elevator at a hospital, just before going into a coma, 13 years ago.  As the session progressed, he was able to move to a 7 and later to a 5.  We both agreed that another session in a few weeks would make sense and likely bring those numbers further down.  He was able to calm down to a "0" and left saying he was tired.  Plan to meet again in one week. [name], LCSW

## 2015-04-25 ENCOUNTER — Ambulatory Visit: Payer: Medicare Other

## 2015-04-25 DIAGNOSIS — F4310 Post-traumatic stress disorder, unspecified: Secondary | ICD-10-CM

## 2015-04-25 NOTE — BH Specialist Note (Signed)
Mitchell Bird was in a good mood today, reporting that since our EMDR session last week, his anxiety has decreased some and he is sleeping better.  I told him I was glad to hear this.  He said the session exhausted him, but he was able to recover by the next day.  He also found some "binaural beat" music on Youtube, as I suggested, and has been listening to that at night and it has helped him fall asleep.  He talked some today about his family and how some of them still don't know he is gay, but they are older and he doesn't want to jeopardize their relationship.  He said he would like to proceed with another EMDR session next week. Mitchell FortsKenny Assad Harbeson, LCSW

## 2015-05-02 ENCOUNTER — Ambulatory Visit: Payer: Medicare Other

## 2015-05-02 NOTE — BH Specialist Note (Signed)
Mitchell PaulaJeff was in a pleasant mood today and came in prepared to do EMDR again, so after a few minutes of chatting, we got into the EMDR session.  He did a good job of getting into the moment of the trauma and later was able to calm back down.  He said he was surprised at how intense it became, thinking it would be less intense today, since it was the second time.  I pointed out that it was a gradual process and that it was okay for that to happen.  We both agreed to take a break from it next session and to return to EMDR after that.  Plan to meet in one week. Mitchell FortsKenny Amori Cooperman, LCSW

## 2015-05-09 ENCOUNTER — Ambulatory Visit: Payer: Medicare Other

## 2015-05-09 DIAGNOSIS — F4310 Post-traumatic stress disorder, unspecified: Secondary | ICD-10-CM

## 2015-05-09 NOTE — BH Specialist Note (Signed)
Mitchell Bird was in a pleasant mood today and reported that on 2 separate nights, he got a good night's sleep without taking his trazadone or his Ambien.  He said he was proud of himself for this and I agreed.  We discussed [facility] to [facility] psychiatrist [facility] who prescribes these and asking her about tapering off the trazadone.  He also talked about his family and how he wishes they could all get together sometime, but [place].  He also talked about his mother and how she is "his world".  He said she tells him he should try to meet someone, but he said they would have to accept that he lives with her. Mitchell FortsKenny Hadlea Furuya, LCSW

## 2015-05-18 ENCOUNTER — Ambulatory Visit: Payer: Medicare Other

## 2015-05-23 ENCOUNTER — Ambulatory Visit: Payer: Medicare Other

## 2015-06-17 ENCOUNTER — Other Ambulatory Visit: Payer: Self-pay | Admitting: Infectious Diseases

## 2015-06-19 ENCOUNTER — Other Ambulatory Visit: Payer: Self-pay | Admitting: *Deleted

## 2015-06-19 DIAGNOSIS — B20 Human immunodeficiency virus [HIV] disease: Secondary | ICD-10-CM

## 2015-06-19 MED ORDER — RITONAVIR 100 MG PO TABS: 100.0000 mg | ORAL_TABLET | Freq: Every day | ORAL | Status: DC

## 2015-06-19 MED ORDER — ATAZANAVIR SULFATE 300 MG PO CAPS: ORAL_CAPSULE | ORAL | Status: DC

## 2015-06-19 MED ORDER — ABACAVIR SULFATE-LAMIVUDINE 600-300 MG PO TABS: 1.0000 | ORAL_TABLET | Freq: Every day | ORAL | Status: DC

## 2015-08-09 ENCOUNTER — Telehealth: Payer: Self-pay | Admitting: *Deleted

## 2015-08-09 NOTE — Telephone Encounter (Signed)
Please refill  thanks

## 2015-08-09 NOTE — Telephone Encounter (Signed)
Pt requesting refill for "outdated" medication, gabapentin (2014).  Last prescribed in 2014.   MD please advise about refill.

## 2015-08-15 MED ORDER — GABAPENTIN 300 MG PO CAPS: ORAL_CAPSULE | ORAL | Status: AC

## 2015-08-15 NOTE — Telephone Encounter (Signed)
Pt informed that refill will be ready for pick-up this evening.

## 2015-08-15 NOTE — Addendum Note (Signed)
Addended by: Jennet MaduroESTRIDGE, DENISE D on: 08/15/2015 04:36 PM   Modules accepted: Orders

## 2015-09-18 ENCOUNTER — Other Ambulatory Visit: Payer: Medicare Other

## 2015-09-21 ENCOUNTER — Other Ambulatory Visit: Payer: Medicare Other

## 2015-09-21 DIAGNOSIS — B20 Human immunodeficiency virus [HIV] disease: Secondary | ICD-10-CM

## 2015-09-21 DIAGNOSIS — Z113 Encounter for screening for infections with a predominantly sexual mode of transmission: Secondary | ICD-10-CM

## 2015-09-21 DIAGNOSIS — Z79899 Other long term (current) drug therapy: Secondary | ICD-10-CM

## 2015-09-21 LAB — LIPID PANEL
Cholesterol: 132 mg/dL (ref 125–200)
HDL: 49 mg/dL (ref >=40)
LDL CALC: 64 mg/dL (ref <130)
Total CHOL/HDL Ratio: 2.7 Ratio (ref <=5.0)
Triglycerides: 97 mg/dL (ref <150)
VLDL: 19 mg/dL (ref <30)

## 2015-09-21 LAB — COMPREHENSIVE METABOLIC PANEL
ALBUMIN: 4.4 g/dL (ref 3.6–5.1)
ALK PHOS: 77 U/L (ref 40–115)
ALT: 21 U/L (ref 9–46)
AST: 19 U/L (ref 10–35)
BILIRUBIN TOTAL: 4.5 mg/dL — AB (ref 0.2–1.2)
BUN: 9 mg/dL (ref 7–25)
CALCIUM: 9.9 mg/dL (ref 8.6–10.3)
CO2: 28 mmol/L (ref 20–31)
Chloride: 104 mmol/L (ref 98–110)
Creat: 1.5 mg/dL — ABNORMAL HIGH (ref 0.70–1.33)
Glucose, Bld: 82 mg/dL (ref 65–99)
POTASSIUM: 4 mmol/L (ref 3.5–5.3)
Sodium: 141 mmol/L (ref 135–146)
Total Protein: 7.1 g/dL (ref 6.1–8.1)

## 2015-09-21 LAB — CBC
HEMATOCRIT: 47.6 % (ref 38.5–50.0)
HEMOGLOBIN: 16.1 g/dL (ref 13.2–17.1)
MCH: 34 pg — ABNORMAL HIGH (ref 27.0–33.0)
MCHC: 33.8 g/dL (ref 32.0–36.0)
MCV: 100.4 fL — AB (ref 80.0–100.0)
MPV: 10.9 fL (ref 7.5–12.5)
Platelets: 136 10*3/uL — ABNORMAL LOW (ref 140–400)
RBC: 4.74 MIL/uL (ref 4.20–5.80)
RDW: 15.1 % — ABNORMAL HIGH (ref 11.0–15.0)
WBC: 5.5 10*3/uL (ref 3.8–10.8)

## 2015-09-22 LAB — RPR

## 2015-09-22 LAB — T-HELPER CELL (CD4) - (RCID CLINIC ONLY)
CD4 % Helper T Cell: 25 % — ABNORMAL LOW (ref 33–55)
CD4 T Cell Abs: 650 /uL (ref 400–2700)

## 2015-09-22 LAB — HIV-1 RNA QUANT-NO REFLEX-BLD
HIV 1 RNA Quant: 797 copies/mL — ABNORMAL HIGH (ref <20)
HIV-1 RNA Quant, Log: 2.9 Log copies/mL — ABNORMAL HIGH (ref <1.30)

## 2015-10-05 ENCOUNTER — Ambulatory Visit: Payer: Medicare Other | Admitting: Infectious Diseases

## 2015-10-13 ENCOUNTER — Ambulatory Visit: Payer: Medicare Other | Admitting: Infectious Diseases

## 2016-06-27 DIAGNOSIS — F331 Major depressive disorder, recurrent, moderate: Secondary | ICD-10-CM | POA: Diagnosis not present

## 2016-07-08 ENCOUNTER — Ambulatory Visit: Payer: Medicare Other | Admitting: Infectious Diseases

## 2016-07-31 DIAGNOSIS — F331 Major depressive disorder, recurrent, moderate: Secondary | ICD-10-CM | POA: Diagnosis not present

## 2016-09-13 DIAGNOSIS — F331 Major depressive disorder, recurrent, moderate: Secondary | ICD-10-CM | POA: Diagnosis not present

## 2016-09-24 ENCOUNTER — Other Ambulatory Visit: Payer: Medicare Other

## 2016-09-24 DIAGNOSIS — B20 Human immunodeficiency virus [HIV] disease: Secondary | ICD-10-CM

## 2016-09-24 DIAGNOSIS — Z113 Encounter for screening for infections with a predominantly sexual mode of transmission: Secondary | ICD-10-CM

## 2016-09-24 DIAGNOSIS — Z79899 Other long term (current) drug therapy: Secondary | ICD-10-CM | POA: Diagnosis not present

## 2016-09-24 LAB — COMPREHENSIVE METABOLIC PANEL
ALK PHOS: 80 U/L (ref 40–115)
ALT: 10 U/L (ref 9–46)
AST: 16 U/L (ref 10–35)
Albumin: 3.8 g/dL (ref 3.6–5.1)
BILIRUBIN TOTAL: 0.5 mg/dL (ref 0.2–1.2)
BUN: 8 mg/dL (ref 7–25)
CALCIUM: 10.1 mg/dL (ref 8.6–10.3)
CO2: 23 mmol/L (ref 20–31)
Chloride: 107 mmol/L (ref 98–110)
Creat: 1.61 mg/dL — ABNORMAL HIGH (ref 0.70–1.33)
GLUCOSE: 84 mg/dL (ref 65–99)
POTASSIUM: 4.3 mmol/L (ref 3.5–5.3)
Sodium: 143 mmol/L (ref 135–146)
Total Protein: 7.5 g/dL (ref 6.1–8.1)

## 2016-09-24 LAB — LIPID PANEL
CHOLESTEROL: 119 mg/dL (ref <200)
HDL: 41 mg/dL (ref >40)
LDL Cholesterol: 51 mg/dL (ref <100)
TRIGLYCERIDES: 133 mg/dL (ref <150)
Total CHOL/HDL Ratio: 2.9 Ratio (ref <5.0)
VLDL: 27 mg/dL (ref <30)

## 2016-09-25 LAB — CBC WITH DIFFERENTIAL/PLATELET
Basophils Absolute: 29 cells/uL (ref 0–200)
Basophils Relative: 1 %
Eosinophils Absolute: 87 cells/uL (ref 15–500)
Eosinophils Relative: 3 %
HEMATOCRIT: 42.3 % (ref 38.5–50.0)
Hemoglobin: 14.1 g/dL (ref 13.2–17.1)
LYMPHS PCT: 38 %
Lymphs Abs: 1102 cells/uL (ref 850–3900)
MCH: 31.8 pg (ref 27.0–33.0)
MCHC: 33.3 g/dL (ref 32.0–36.0)
MCV: 95.3 fL (ref 80.0–100.0)
MONOS PCT: 10 %
MPV: 11.9 fL (ref 7.5–12.5)
Monocytes Absolute: 290 cells/uL (ref 200–950)
NEUTROS PCT: 48 %
Neutro Abs: 1392 cells/uL — ABNORMAL LOW (ref 1500–7800)
RBC: 4.44 MIL/uL (ref 4.20–5.80)
RDW: 14.4 % (ref 11.0–15.0)
WBC: 2.9 10*3/uL — AB (ref 3.8–10.8)

## 2016-09-25 LAB — T-HELPER CELL (CD4) - (RCID CLINIC ONLY)
CD4 % Helper T Cell: 10 % — ABNORMAL LOW (ref 33–55)
CD4 T Cell Abs: 130 /uL — ABNORMAL LOW (ref 400–2700)

## 2016-09-25 LAB — RPR

## 2016-09-26 LAB — HIV-1 RNA QUANT-NO REFLEX-BLD
HIV 1 RNA Quant: 631000 copies/mL — ABNORMAL HIGH
HIV-1 RNA QUANT, LOG: 5.8 {Log_copies}/mL — AB

## 2016-10-07 ENCOUNTER — Ambulatory Visit (INDEPENDENT_AMBULATORY_CARE_PROVIDER_SITE_OTHER): Payer: Medicare Other | Admitting: Infectious Diseases

## 2016-10-07 ENCOUNTER — Telehealth: Payer: Self-pay | Admitting: Pharmacist Clinician (PhC)/ Clinical Pharmacy Specialist

## 2016-10-07 ENCOUNTER — Encounter: Payer: Self-pay | Admitting: Infectious Diseases

## 2016-10-07 VITALS — BP 129/85 | HR 93 | Temp 97.9°F | Ht 75.0 in | Wt 153.0 lb

## 2016-10-07 DIAGNOSIS — F17200 Nicotine dependence, unspecified, uncomplicated: Secondary | ICD-10-CM | POA: Diagnosis not present

## 2016-10-07 DIAGNOSIS — B370 Candidal stomatitis: Secondary | ICD-10-CM

## 2016-10-07 DIAGNOSIS — B3781 Candidal esophagitis: Secondary | ICD-10-CM | POA: Diagnosis not present

## 2016-10-07 DIAGNOSIS — B20 Human immunodeficiency virus [HIV] disease: Secondary | ICD-10-CM | POA: Diagnosis present

## 2016-10-07 MED ORDER — BICTEGRAVIR-EMTRICITAB-TENOFOV 50-200-25 MG PO TABS: 1.0000 | ORAL_TABLET | Freq: Every day | ORAL | 3 refills | Status: DC

## 2016-10-07 MED ORDER — FLUCONAZOLE 100 MG PO TABS: 100.0000 mg | ORAL_TABLET | Freq: Every day | ORAL | 0 refills | Status: AC

## 2016-10-07 MED ORDER — SULFAMETHOXAZOLE-TRIMETHOPRIM 800-160 MG PO TABS: 1.0000 | ORAL_TABLET | ORAL | 1 refills | Status: DC

## 2016-10-07 NOTE — Telephone Encounter (Signed)
Error

## 2016-10-07 NOTE — Assessment & Plan Note (Signed)
At only 2 cig/week.  Encouraged to qtui.

## 2016-10-07 NOTE — Assessment & Plan Note (Signed)
Will give him 10 days of diflucan.

## 2016-10-07 NOTE — Assessment & Plan Note (Addendum)
Will try biktarvy.  Will have him meet with phram Will give bactrim 1 ds tiw for 90 days Will see him back in 8 weeks.  Given boost.  Will schedule for colon at f/u visit Offered/refused condoms.   Will give tet vax

## 2016-10-07 NOTE — Progress Notes (Signed)
HPI: Mitchell Bird is a 56 y.o. male who presents to the [facility] today to follow-up with Dr. Ninetta LightsHatcher for his HIV.   Allergies: No Known Allergies  Past Medical History: Past Medical History:  Diagnosis Date  . HIV infection [facility](HCC)     Social History: Social History   Social History  . Marital status: Single    Spouse name: N/A  . Number of children: N/A  . Years of education: N/A   Social History Main Topics  . Smoking status: Light Tobacco Smoker    Packs/day: 0.30    Years: 4.00    Types: Cigarettes  . Smokeless tobacco: Never Used     Comment: congratulated!!  . Alcohol use No     Comment: rarely  wine  . Drug use: No  . Sexual activity: No     Comment: pt. declined condoms   Other Topics Concern  . None   Social History Narrative  . None    Current Regimen: None x 6 months  Labs: HIV 1 RNA Quant (copies/mL)  Date Value  09/24/2016 631,000 (H)  09/21/2015 797 (H)  02/07/2015 <20   CD4 T Cell Abs (/uL)  Date Value  09/24/2016 130 (L)  09/21/2015 650  02/07/2015 470   Hep B S Ab (no units)  Date Value  11/22/2013 NEG   Hepatitis B Surface Ag (no units)  Date Value  07/14/2006 NO   HCV Ab (no units)  Date Value  07/14/2006 NO    CrCl: Estimated Creatinine Clearance: 50.9 mL/min (A) (by C-G formula based on SCr of 1.61 mg/dL (H)).  Lipids:    Component Value Date/Time   CHOL 119 09/24/2016 1407   TRIG 133 09/24/2016 1407   HDL 41 09/24/2016 1407   CHOLHDL 2.9 09/24/2016 1407   VLDL 27 09/24/2016 1407   LDLCALC 51 09/24/2016 1407    Assessment: Mitchell Bird is here today to follow-up with Dr. Ninetta LightsHatcher after being off of medications x 6 months. He has not been seen in clinic since October 2016.  He states he hasn't had the time to make a clinic appointment to get refills so that is why he has been off of medications for 6 months. He is usually very compliant and does not miss doses when he does have medications.  Per Dr. Ninetta LightsHatcher, we will  start him on Biktarvy today.  Counseled him on the side effect profile of Biktarvy and to take it with or without food.  Encouraged him to take it at the same time every day.  I believe his insurance will cover it, but if not, I will call him to discuss other options.  I will make a f/u with me in ~1 month.    Plans: - Start Biktarvy 1 pill PO once daily - F/u with me 7/2 at 11am - F/u with Dr. Ninetta LightsHatcher 11/21 at 10:45am  Eilis Chestnutt L. Chanler Mendonca, PharmD, CPP Infectious Diseases Clinical Pharmacist [facility] 10/07/2016, 2:55 PM

## 2016-10-07 NOTE — Progress Notes (Signed)
   Subjective:    Patient ID: Mitchell Bird, male    DOB: [birth year], 56 y.o.   MRN: [id number]  HPI 56 yo M with hx of HIV/AIDS since 2004 when he was hospitalized with PCP, complicated by VDRF and CVA. He was started on KLT/CBV/TFV then switched to TRV/ATVr 2007.  Has been seen for CRF by renal. 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 Was switched to EPZ/ATVr (08-21-11).  His last ID clinic visit was 02-2015.  He has been off ART for 6 months. Has not been able to make MD appt.  He has no preference about which medication to restart.  His stress level has been ~ 7. Has been getting counseling in [place]. Has been helpful. Has stress from watching his mother, who is 74.    HIV 1 RNA Quant (copies/mL)  Date Value  09/24/2016 631,000 (H)  09/21/2015 797 (H)  02/07/2015 <20   CD4 T Cell Abs (/uL)  Date Value  09/24/2016 130 (L)  09/21/2015 650  02/07/2015 470    Review of Systems  Constitutional: Positive for appetite change and unexpected weight change.  HENT: Negative for mouth sores.   Respiratory: Negative for cough and shortness of breath.   Gastrointestinal: Negative for diarrhea and nausea.  Genitourinary: Negative for difficulty urinating.  Neurological: Negative for dizziness and headaches.  Psychiatric/Behavioral: Negative for dysphoric mood. The patient is nervous/anxious.   40# down over 1.5 years.  Constant indigestion.     Objective:   Physical Exam  Constitutional: He appears well-developed and well-nourished.  HENT:  Mouth/Throat: Oropharyngeal exudate present.  Eyes: EOM are normal. Pupils are equal, round, and reactive to light.  Neck: Neck supple.  Cardiovascular: Normal rate, regular rhythm and normal heart sounds.   Pulmonary/Chest: Effort normal and breath sounds normal.  Abdominal: Soft. Bowel sounds are normal. There is no tenderness. There is no rebound.  Musculoskeletal: He exhibits no edema.  Lymphadenopathy:    He has no cervical adenopathy.  Psychiatric:  He has a normal mood and affect.      Assessment & Plan:

## 2016-11-18 ENCOUNTER — Ambulatory Visit: Payer: Medicare Other

## 2016-11-25 DIAGNOSIS — F331 Major depressive disorder, recurrent, moderate: Secondary | ICD-10-CM | POA: Diagnosis not present

## 2016-12-20 DIAGNOSIS — F331 Major depressive disorder, recurrent, moderate: Secondary | ICD-10-CM | POA: Diagnosis not present

## 2016-12-23 DIAGNOSIS — F331 Major depressive disorder, recurrent, moderate: Secondary | ICD-10-CM | POA: Diagnosis not present

## 2016-12-25 DIAGNOSIS — F331 Major depressive disorder, recurrent, moderate: Secondary | ICD-10-CM | POA: Diagnosis not present

## 2016-12-31 DIAGNOSIS — F331 Major depressive disorder, recurrent, moderate: Secondary | ICD-10-CM | POA: Diagnosis not present

## 2017-01-07 DIAGNOSIS — F331 Major depressive disorder, recurrent, moderate: Secondary | ICD-10-CM | POA: Diagnosis not present

## 2017-03-06 DIAGNOSIS — F331 Major depressive disorder, recurrent, moderate: Secondary | ICD-10-CM | POA: Diagnosis not present

## 2017-04-09 ENCOUNTER — Ambulatory Visit: Payer: Medicare Other | Admitting: Infectious Diseases

## 2017-05-19 DIAGNOSIS — F331 Major depressive disorder, recurrent, moderate: Secondary | ICD-10-CM | POA: Diagnosis not present

## 2017-06-09 DIAGNOSIS — F331 Major depressive disorder, recurrent, moderate: Secondary | ICD-10-CM | POA: Diagnosis not present

## 2017-09-24 DIAGNOSIS — F331 Major depressive disorder, recurrent, moderate: Secondary | ICD-10-CM | POA: Diagnosis not present

## 2017-10-08 ENCOUNTER — Telehealth: Payer: Self-pay

## 2017-10-08 NOTE — Telephone Encounter (Signed)
Left a vm for pt to call back to make an appt to see Dr. Ninetta Lights . Mitchell Bird, [place]

## 2017-10-22 DIAGNOSIS — F331 Major depressive disorder, recurrent, moderate: Secondary | ICD-10-CM | POA: Diagnosis not present

## 2017-11-19 DIAGNOSIS — F331 Major depressive disorder, recurrent, moderate: Secondary | ICD-10-CM | POA: Diagnosis not present

## 2018-03-17 DIAGNOSIS — F331 Major depressive disorder, recurrent, moderate: Secondary | ICD-10-CM | POA: Diagnosis not present

## 2018-03-18 ENCOUNTER — Encounter: Payer: Self-pay | Admitting: Infectious Diseases

## 2018-03-18 ENCOUNTER — Ambulatory Visit (INDEPENDENT_AMBULATORY_CARE_PROVIDER_SITE_OTHER): Payer: Medicare Other | Admitting: Infectious Diseases

## 2018-03-18 VITALS — BP 122/76 | HR 74 | Temp 98.6°F | Wt 168.1 lb

## 2018-03-18 DIAGNOSIS — Z23 Encounter for immunization: Secondary | ICD-10-CM

## 2018-03-18 DIAGNOSIS — Z79899 Other long term (current) drug therapy: Secondary | ICD-10-CM

## 2018-03-18 DIAGNOSIS — N183 Chronic kidney disease, stage 3 unspecified: Secondary | ICD-10-CM

## 2018-03-18 DIAGNOSIS — F5102 Adjustment insomnia: Secondary | ICD-10-CM

## 2018-03-18 DIAGNOSIS — B20 Human immunodeficiency virus [HIV] disease: Secondary | ICD-10-CM | POA: Diagnosis not present

## 2018-03-18 DIAGNOSIS — Z0000 Encounter for general adult medical examination without abnormal findings: Secondary | ICD-10-CM

## 2018-03-18 DIAGNOSIS — Z113 Encounter for screening for infections with a predominantly sexual mode of transmission: Secondary | ICD-10-CM | POA: Diagnosis not present

## 2018-03-18 DIAGNOSIS — F17200 Nicotine dependence, unspecified, uncomplicated: Secondary | ICD-10-CM

## 2018-03-18 MED ORDER — BUPROPION HCL ER (XL) 300 MG PO TB24: 300.0000 mg | ORAL_TABLET | Freq: Every day | ORAL | 2 refills | Status: DC

## 2018-03-18 MED ORDER — ZOLPIDEM TARTRATE 10 MG PO TABS: 10.0000 mg | ORAL_TABLET | Freq: Every evening | ORAL | 0 refills | Status: AC | PRN

## 2018-03-18 MED ORDER — BICTEGRAVIR-EMTRICITAB-TENOFOV 50-200-25 MG PO TABS: 1.0000 | ORAL_TABLET | Freq: Every day | ORAL | 3 refills | Status: DC

## 2018-03-18 NOTE — Assessment & Plan Note (Signed)
wil refill his trazadone, he will f/u at mental health.

## 2018-03-18 NOTE — Assessment & Plan Note (Signed)
Will repeat his labs today.  

## 2018-03-18 NOTE — Assessment & Plan Note (Addendum)
Will work on getting colon when he returns.  Needs Tdap

## 2018-03-18 NOTE — Addendum Note (Signed)
Addended by: Mariea Clonts D on: 03/18/2018 11:56 AM   Modules accepted: Orders

## 2018-03-18 NOTE — Progress Notes (Signed)
   Subjective:    Patient ID: Mitchell Bird, male    DOB: [birth year], 57 y.o.   MRN: [id number]  HPI 57 yo M with hx of HIV/AIDS since 2004 when he was hospitalized with PCP, complicated by VDRF and CVA. He was started on kaletra/combivir/tenofovir then switched to truvada/reyetaz-norvirr 2007.  Has been seen for CRF by renal. 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 Was switched to epzicom/reyetaz-norvir(08-21-11). He was in hospital at [facility] 04-2017 for cocaine abuse.  His last ID clinic visit was 09-2016.  Has been back and forth between here and [place] his mom is now (she has dementia and more family there). Knows he needs to take care of himself better- exercise, better sleep. Has struggled with mom being away as he has been with her for a long time, she took care of him when he was sick (and he is unable to care for her when she is ill).  He has been on biktarvy. Has had lapse in medication.    HIV 1 RNA Quant (copies/mL)  Date Value  09/24/2016 631,000 (H)  09/21/2015 797 (H)  02/07/2015 <20   CD4 T Cell Abs (/uL)  Date Value  09/24/2016 130 (L)  09/21/2015 650  02/07/2015 470    Review of Systems  Constitutional: Negative for appetite change and unexpected weight change.  Respiratory: Negative for cough and shortness of breath.   Gastrointestinal: Negative for constipation and diarrhea.  Genitourinary: Positive for decreased urine volume. Negative for difficulty urinating.  Psychiatric/Behavioral: Positive for dysphoric mood and sleep disturbance.  Weak urine stream, needs to get up at night to go to bathroom.  Sleeping 3h/night. Has been going to [facility].  Please see HPI. All other systems reviewed and negative.     Objective:   Physical Exam  Constitutional: He is oriented to person, place, and time. He appears well-developed and well-nourished.  HENT:  Mouth/Throat: No oropharyngeal exudate.  Eyes: Pupils are equal, round, and reactive to light. EOM are  normal.  Neck: Normal range of motion. Neck supple.  Cardiovascular: Normal rate, regular rhythm and normal heart sounds.  Pulmonary/Chest: Effort normal and breath sounds normal.  Abdominal: Soft. Bowel sounds are normal. He exhibits no distension. There is no tenderness.  Musculoskeletal: He exhibits no edema.  Lymphadenopathy:    He has no cervical adenopathy.  Neurological: He is alert and oriented to person, place, and time.  Psychiatric: He has a normal mood and affect.      Assessment & Plan:

## 2018-03-18 NOTE — Assessment & Plan Note (Signed)
Quit 3 months ago. Encouraged pt.

## 2018-03-18 NOTE — Addendum Note (Signed)
Addended by: Gerarda Fraction on: 03/18/2018 12:26 PM   Modules accepted: Orders

## 2018-03-18 NOTE — Assessment & Plan Note (Signed)
Will refill his biktarvy Offered/refused condoms.  Flu vax and pcv today Labs today rtc in 3 months

## 2018-03-18 NOTE — Assessment & Plan Note (Signed)
He will continue to follow up in [place]. His mood is good today.

## 2018-03-19 DIAGNOSIS — Z23 Encounter for immunization: Secondary | ICD-10-CM | POA: Diagnosis not present

## 2018-03-19 LAB — T-HELPER CELL (CD4) - (RCID CLINIC ONLY)
CD4 T CELL HELPER: 10 % — AB (ref 33–55)
CD4 T Cell Abs: 130 /uL — ABNORMAL LOW (ref 400–2700)

## 2018-03-21 LAB — LIPID PANEL
Cholesterol: 135 mg/dL (ref <200)
HDL: 41 mg/dL (ref >40)
LDL CHOLESTEROL (CALC): 76 mg/dL
NON-HDL CHOLESTEROL (CALC): 94 mg/dL (ref <130)
TRIGLYCERIDES: 95 mg/dL (ref <150)
Total CHOL/HDL Ratio: 3.3 (calc) (ref <5.0)

## 2018-03-21 LAB — CBC
HCT: 41.1 % (ref 38.5–50.0)
Hemoglobin: 14 g/dL (ref 13.2–17.1)
MCH: 33.3 pg — AB (ref 27.0–33.0)
MCHC: 34.1 g/dL (ref 32.0–36.0)
MCV: 97.6 fL (ref 80.0–100.0)
MPV: 11.5 fL (ref 7.5–12.5)
PLATELETS: 104 10*3/uL — AB (ref 140–400)
RBC: 4.21 10*6/uL (ref 4.20–5.80)
RDW: 13.5 % (ref 11.0–15.0)
WBC: 3.7 10*3/uL — ABNORMAL LOW (ref 3.8–10.8)

## 2018-03-21 LAB — COMPREHENSIVE METABOLIC PANEL
AG Ratio: 1.5 (calc) (ref 1.0–2.5)
ALT: 14 U/L (ref 9–46)
AST: 20 U/L (ref 10–35)
Albumin: 4.3 g/dL (ref 3.6–5.1)
Alkaline phosphatase (APISO): 76 U/L (ref 40–115)
BUN/Creatinine Ratio: 7 (calc) (ref 6–22)
BUN: 11 mg/dL (ref 7–25)
CHLORIDE: 107 mmol/L (ref 98–110)
CO2: 26 mmol/L (ref 20–32)
CREATININE: 1.48 mg/dL — AB (ref 0.70–1.33)
Calcium: 9.4 mg/dL (ref 8.6–10.3)
GLOBULIN: 2.9 g/dL (ref 1.9–3.7)
GLUCOSE: 82 mg/dL (ref 65–99)
Potassium: 3.8 mmol/L (ref 3.5–5.3)
Sodium: 141 mmol/L (ref 135–146)
Total Bilirubin: 0.5 mg/dL (ref 0.2–1.2)
Total Protein: 7.2 g/dL (ref 6.1–8.1)

## 2018-03-21 LAB — HIV-1 RNA QUANT-NO REFLEX-BLD
HIV 1 RNA Quant: 157000 copies/mL — ABNORMAL HIGH
HIV-1 RNA Quant, Log: 5.2 Log copies/mL — ABNORMAL HIGH

## 2018-03-21 LAB — RPR: RPR Ser Ql: NONREACTIVE

## 2018-05-27 ENCOUNTER — Encounter: Payer: Self-pay | Admitting: Behavioral Health

## 2018-05-27 ENCOUNTER — Other Ambulatory Visit: Payer: Self-pay | Admitting: Behavioral Health

## 2018-05-27 DIAGNOSIS — B20 Human immunodeficiency virus [HIV] disease: Secondary | ICD-10-CM

## 2018-05-27 DIAGNOSIS — F5102 Adjustment insomnia: Secondary | ICD-10-CM

## 2018-05-27 MED ORDER — BICTEGRAVIR-EMTRICITAB-TENOFOV 50-200-25 MG PO TABS: 1.0000 | ORAL_TABLET | Freq: Every day | ORAL | 2 refills | Status: DC

## 2018-05-27 MED ORDER — BUPROPION HCL ER (XL) 300 MG PO TB24: 300.0000 mg | ORAL_TABLET | Freq: Every day | ORAL | 1 refills | Status: AC

## 2018-08-20 ENCOUNTER — Telehealth: Payer: Self-pay

## 2018-08-20 ENCOUNTER — Other Ambulatory Visit: Payer: Self-pay | Admitting: Behavioral Health

## 2018-08-20 DIAGNOSIS — B20 Human immunodeficiency virus [HIV] disease: Secondary | ICD-10-CM

## 2018-08-20 MED ORDER — BICTEGRAVIR-EMTRICITAB-TENOFOV 50-200-25 MG PO TABS: 1.0000 | ORAL_TABLET | Freq: Every day | ORAL | 0 refills | Status: DC

## 2018-08-20 NOTE — Telephone Encounter (Signed)
Attempted to call patient to schedule appointment with our office. Patient is overdue for office visit with Dr. Ninetta Lights. Left voicemail requesting patient call office back to get appointment set up. Lorenso Courier, [place]

## 2018-08-20 NOTE — Progress Notes (Signed)
Patient called stating he recently moved to [facility].  He is still in the process of finding a new ID provider in [place]. Angeline Slim RN

## 2018-08-31 ENCOUNTER — Telehealth: Payer: Self-pay

## 2018-08-31 NOTE — Telephone Encounter (Signed)
Attempted to call [facility] to schedule appointment for continuity of care. Left message to call office back.   Gerarda Fraction, [place]

## 2018-09-11 NOTE — Telephone Encounter (Signed)
Called patient left message to call for appointment  °

## 2019-02-03 DIAGNOSIS — Z23 Encounter for immunization: Secondary | ICD-10-CM | POA: Diagnosis not present

## 2019-04-30 ENCOUNTER — Other Ambulatory Visit: Payer: Self-pay

## 2019-04-30 DIAGNOSIS — B20 Human immunodeficiency virus [HIV] disease: Secondary | ICD-10-CM

## 2019-04-30 MED ORDER — BICTEGRAVIR-EMTRICITAB-TENOFOV 50-200-25 MG PO TABS: 1.0000 | ORAL_TABLET | Freq: Every day | ORAL | 0 refills | Status: AC

## 2019-04-30 NOTE — Progress Notes (Signed)
Patient moved to [facility], has not established care since moving there in October. Patient was instructed to establish care while living there. Understands office will only send 30 day supply of medication.  Patient will contact health department for ID clinic information. Will inform [facility] patient has transferred care Okay to refill per MD Aundria Rud, [place]

## 2019-07-05 DIAGNOSIS — N390 Urinary tract infection, site not specified: Secondary | ICD-10-CM | POA: Diagnosis not present

## 2019-07-05 DIAGNOSIS — Z008 Encounter for other general examination: Secondary | ICD-10-CM | POA: Diagnosis not present

## 2019-07-06 DIAGNOSIS — F411 Generalized anxiety disorder: Secondary | ICD-10-CM | POA: Diagnosis not present

## 2019-07-07 DIAGNOSIS — F411 Generalized anxiety disorder: Secondary | ICD-10-CM | POA: Diagnosis not present

## 2019-07-08 DIAGNOSIS — F411 Generalized anxiety disorder: Secondary | ICD-10-CM | POA: Diagnosis not present

## 2019-07-09 DIAGNOSIS — F411 Generalized anxiety disorder: Secondary | ICD-10-CM | POA: Diagnosis not present

## 2019-07-12 DIAGNOSIS — F411 Generalized anxiety disorder: Secondary | ICD-10-CM | POA: Diagnosis not present

## 2019-07-13 DIAGNOSIS — F411 Generalized anxiety disorder: Secondary | ICD-10-CM | POA: Diagnosis not present

## 2019-07-14 DIAGNOSIS — F411 Generalized anxiety disorder: Secondary | ICD-10-CM | POA: Diagnosis not present

## 2019-07-15 DIAGNOSIS — F411 Generalized anxiety disorder: Secondary | ICD-10-CM | POA: Diagnosis not present

## 2019-07-16 DIAGNOSIS — F411 Generalized anxiety disorder: Secondary | ICD-10-CM | POA: Diagnosis not present

## 2019-07-19 DIAGNOSIS — Z9049 Acquired absence of other specified parts of digestive tract: Secondary | ICD-10-CM | POA: Diagnosis not present

## 2019-07-19 DIAGNOSIS — Z791 Long term (current) use of non-steroidal anti-inflammatories (NSAID): Secondary | ICD-10-CM | POA: Diagnosis not present

## 2019-07-19 DIAGNOSIS — J039 Acute tonsillitis, unspecified: Secondary | ICD-10-CM | POA: Diagnosis not present

## 2019-07-19 DIAGNOSIS — F17200 Nicotine dependence, unspecified, uncomplicated: Secondary | ICD-10-CM | POA: Diagnosis not present

## 2019-07-19 DIAGNOSIS — Z79899 Other long term (current) drug therapy: Secondary | ICD-10-CM | POA: Diagnosis not present

## 2019-07-19 DIAGNOSIS — Z792 Long term (current) use of antibiotics: Secondary | ICD-10-CM | POA: Diagnosis not present

## 2019-07-19 DIAGNOSIS — F411 Generalized anxiety disorder: Secondary | ICD-10-CM | POA: Diagnosis not present

## 2019-07-20 DIAGNOSIS — Z79899 Other long term (current) drug therapy: Secondary | ICD-10-CM | POA: Diagnosis not present

## 2019-07-20 DIAGNOSIS — F411 Generalized anxiety disorder: Secondary | ICD-10-CM | POA: Diagnosis not present

## 2019-07-20 DIAGNOSIS — F17200 Nicotine dependence, unspecified, uncomplicated: Secondary | ICD-10-CM | POA: Diagnosis not present

## 2019-07-20 DIAGNOSIS — J029 Acute pharyngitis, unspecified: Secondary | ICD-10-CM | POA: Diagnosis not present

## 2019-07-20 DIAGNOSIS — R070 Pain in throat: Secondary | ICD-10-CM | POA: Diagnosis not present

## 2019-07-21 DIAGNOSIS — F411 Generalized anxiety disorder: Secondary | ICD-10-CM | POA: Diagnosis not present

## 2019-07-22 DIAGNOSIS — F411 Generalized anxiety disorder: Secondary | ICD-10-CM | POA: Diagnosis not present

## 2019-07-23 DIAGNOSIS — F411 Generalized anxiety disorder: Secondary | ICD-10-CM | POA: Diagnosis not present

## 2019-07-26 DIAGNOSIS — F411 Generalized anxiety disorder: Secondary | ICD-10-CM | POA: Diagnosis not present

## 2019-07-27 DIAGNOSIS — F411 Generalized anxiety disorder: Secondary | ICD-10-CM | POA: Diagnosis not present

## 2019-07-28 DIAGNOSIS — F411 Generalized anxiety disorder: Secondary | ICD-10-CM | POA: Diagnosis not present

## 2019-07-29 DIAGNOSIS — F411 Generalized anxiety disorder: Secondary | ICD-10-CM | POA: Diagnosis not present

## 2019-07-30 DIAGNOSIS — Z915 Personal history of self-harm: Secondary | ICD-10-CM | POA: Diagnosis not present

## 2019-07-30 DIAGNOSIS — F411 Generalized anxiety disorder: Secondary | ICD-10-CM | POA: Diagnosis not present

## 2019-07-30 DIAGNOSIS — Z560 Unemployment, unspecified: Secondary | ICD-10-CM | POA: Diagnosis not present

## 2019-07-30 DIAGNOSIS — Z21 Asymptomatic human immunodeficiency virus [HIV] infection status: Secondary | ICD-10-CM | POA: Diagnosis not present

## 2019-07-30 DIAGNOSIS — F331 Major depressive disorder, recurrent, moderate: Secondary | ICD-10-CM | POA: Diagnosis not present

## 2019-08-02 DIAGNOSIS — Z9049 Acquired absence of other specified parts of digestive tract: Secondary | ICD-10-CM | POA: Diagnosis not present

## 2019-08-02 DIAGNOSIS — Z560 Unemployment, unspecified: Secondary | ICD-10-CM | POA: Diagnosis not present

## 2019-08-02 DIAGNOSIS — F17200 Nicotine dependence, unspecified, uncomplicated: Secondary | ICD-10-CM | POA: Diagnosis not present

## 2019-08-02 DIAGNOSIS — Z21 Asymptomatic human immunodeficiency virus [HIV] infection status: Secondary | ICD-10-CM | POA: Diagnosis not present

## 2019-08-02 DIAGNOSIS — F331 Major depressive disorder, recurrent, moderate: Secondary | ICD-10-CM | POA: Diagnosis not present

## 2019-08-02 DIAGNOSIS — K209 Esophagitis, unspecified without bleeding: Secondary | ICD-10-CM | POA: Diagnosis not present

## 2019-08-02 DIAGNOSIS — Z791 Long term (current) use of non-steroidal anti-inflammatories (NSAID): Secondary | ICD-10-CM | POA: Diagnosis not present

## 2019-08-02 DIAGNOSIS — R079 Chest pain, unspecified: Secondary | ICD-10-CM | POA: Diagnosis not present

## 2019-08-02 DIAGNOSIS — Z915 Personal history of self-harm: Secondary | ICD-10-CM | POA: Diagnosis not present

## 2019-08-02 DIAGNOSIS — F411 Generalized anxiety disorder: Secondary | ICD-10-CM | POA: Diagnosis not present

## 2019-08-02 DIAGNOSIS — B3781 Candidal esophagitis: Secondary | ICD-10-CM | POA: Diagnosis not present

## 2019-08-02 DIAGNOSIS — Z792 Long term (current) use of antibiotics: Secondary | ICD-10-CM | POA: Diagnosis not present

## 2019-08-02 DIAGNOSIS — Z79899 Other long term (current) drug therapy: Secondary | ICD-10-CM | POA: Diagnosis not present

## 2019-08-02 DIAGNOSIS — R0789 Other chest pain: Secondary | ICD-10-CM | POA: Diagnosis not present

## 2019-08-03 DIAGNOSIS — F411 Generalized anxiety disorder: Secondary | ICD-10-CM | POA: Diagnosis not present

## 2019-08-03 DIAGNOSIS — Z915 Personal history of self-harm: Secondary | ICD-10-CM | POA: Diagnosis not present

## 2019-08-03 DIAGNOSIS — F331 Major depressive disorder, recurrent, moderate: Secondary | ICD-10-CM | POA: Diagnosis not present

## 2019-08-03 DIAGNOSIS — Z560 Unemployment, unspecified: Secondary | ICD-10-CM | POA: Diagnosis not present

## 2019-08-03 DIAGNOSIS — Z21 Asymptomatic human immunodeficiency virus [HIV] infection status: Secondary | ICD-10-CM | POA: Diagnosis not present

## 2019-08-04 DIAGNOSIS — F411 Generalized anxiety disorder: Secondary | ICD-10-CM | POA: Diagnosis not present

## 2019-08-04 DIAGNOSIS — Z21 Asymptomatic human immunodeficiency virus [HIV] infection status: Secondary | ICD-10-CM | POA: Diagnosis not present

## 2019-08-04 DIAGNOSIS — Z915 Personal history of self-harm: Secondary | ICD-10-CM | POA: Diagnosis not present

## 2019-08-04 DIAGNOSIS — Z560 Unemployment, unspecified: Secondary | ICD-10-CM | POA: Diagnosis not present

## 2019-08-04 DIAGNOSIS — F331 Major depressive disorder, recurrent, moderate: Secondary | ICD-10-CM | POA: Diagnosis not present

## 2019-08-05 DIAGNOSIS — Z21 Asymptomatic human immunodeficiency virus [HIV] infection status: Secondary | ICD-10-CM | POA: Diagnosis not present

## 2019-08-05 DIAGNOSIS — Z915 Personal history of self-harm: Secondary | ICD-10-CM | POA: Diagnosis not present

## 2019-08-05 DIAGNOSIS — F331 Major depressive disorder, recurrent, moderate: Secondary | ICD-10-CM | POA: Diagnosis not present

## 2019-08-05 DIAGNOSIS — Z560 Unemployment, unspecified: Secondary | ICD-10-CM | POA: Diagnosis not present

## 2019-08-05 DIAGNOSIS — F411 Generalized anxiety disorder: Secondary | ICD-10-CM | POA: Diagnosis not present

## 2019-08-06 DIAGNOSIS — Z915 Personal history of self-harm: Secondary | ICD-10-CM | POA: Diagnosis not present

## 2019-08-06 DIAGNOSIS — F331 Major depressive disorder, recurrent, moderate: Secondary | ICD-10-CM | POA: Diagnosis not present

## 2019-08-06 DIAGNOSIS — Z21 Asymptomatic human immunodeficiency virus [HIV] infection status: Secondary | ICD-10-CM | POA: Diagnosis not present

## 2019-08-06 DIAGNOSIS — Z560 Unemployment, unspecified: Secondary | ICD-10-CM | POA: Diagnosis not present

## 2019-08-06 DIAGNOSIS — F411 Generalized anxiety disorder: Secondary | ICD-10-CM | POA: Diagnosis not present

## 2019-08-11 DIAGNOSIS — F331 Major depressive disorder, recurrent, moderate: Secondary | ICD-10-CM | POA: Diagnosis not present

## 2019-08-18 DIAGNOSIS — F331 Major depressive disorder, recurrent, moderate: Secondary | ICD-10-CM | POA: Diagnosis not present

## 2019-09-20 DIAGNOSIS — F332 Major depressive disorder, recurrent severe without psychotic features: Secondary | ICD-10-CM | POA: Diagnosis not present

## 2019-09-21 DIAGNOSIS — F332 Major depressive disorder, recurrent severe without psychotic features: Secondary | ICD-10-CM | POA: Diagnosis not present

## 2019-09-22 DIAGNOSIS — F332 Major depressive disorder, recurrent severe without psychotic features: Secondary | ICD-10-CM | POA: Diagnosis not present

## 2019-09-23 DIAGNOSIS — F332 Major depressive disorder, recurrent severe without psychotic features: Secondary | ICD-10-CM | POA: Diagnosis not present

## 2019-09-24 DIAGNOSIS — Z21 Asymptomatic human immunodeficiency virus [HIV] infection status: Secondary | ICD-10-CM | POA: Diagnosis not present

## 2019-09-24 DIAGNOSIS — F332 Major depressive disorder, recurrent severe without psychotic features: Secondary | ICD-10-CM | POA: Diagnosis not present

## 2019-09-24 DIAGNOSIS — I10 Essential (primary) hypertension: Secondary | ICD-10-CM | POA: Diagnosis not present

## 2019-09-24 DIAGNOSIS — Z9049 Acquired absence of other specified parts of digestive tract: Secondary | ICD-10-CM | POA: Diagnosis not present

## 2019-09-24 DIAGNOSIS — F17200 Nicotine dependence, unspecified, uncomplicated: Secondary | ICD-10-CM | POA: Diagnosis not present

## 2019-09-24 DIAGNOSIS — K228 Other specified diseases of esophagus: Secondary | ICD-10-CM | POA: Diagnosis not present

## 2019-09-24 DIAGNOSIS — Z79899 Other long term (current) drug therapy: Secondary | ICD-10-CM | POA: Diagnosis not present

## 2019-09-24 DIAGNOSIS — B370 Candidal stomatitis: Secondary | ICD-10-CM | POA: Diagnosis not present

## 2019-09-24 DIAGNOSIS — B20 Human immunodeficiency virus [HIV] disease: Secondary | ICD-10-CM | POA: Diagnosis not present

## 2019-09-27 DIAGNOSIS — F332 Major depressive disorder, recurrent severe without psychotic features: Secondary | ICD-10-CM | POA: Diagnosis not present

## 2019-09-28 DIAGNOSIS — F332 Major depressive disorder, recurrent severe without psychotic features: Secondary | ICD-10-CM | POA: Diagnosis not present

## 2019-09-29 DIAGNOSIS — F332 Major depressive disorder, recurrent severe without psychotic features: Secondary | ICD-10-CM | POA: Diagnosis not present

## 2019-10-01 DIAGNOSIS — F332 Major depressive disorder, recurrent severe without psychotic features: Secondary | ICD-10-CM | POA: Diagnosis not present

## 2019-10-19 DIAGNOSIS — F332 Major depressive disorder, recurrent severe without psychotic features: Secondary | ICD-10-CM | POA: Diagnosis not present

## 2019-10-20 DIAGNOSIS — F332 Major depressive disorder, recurrent severe without psychotic features: Secondary | ICD-10-CM | POA: Diagnosis not present

## 2019-10-21 DIAGNOSIS — F332 Major depressive disorder, recurrent severe without psychotic features: Secondary | ICD-10-CM | POA: Diagnosis not present

## 2019-10-22 DIAGNOSIS — F332 Major depressive disorder, recurrent severe without psychotic features: Secondary | ICD-10-CM | POA: Diagnosis not present

## 2019-10-22 DIAGNOSIS — B20 Human immunodeficiency virus [HIV] disease: Secondary | ICD-10-CM | POA: Diagnosis not present

## 2019-10-22 DIAGNOSIS — F17200 Nicotine dependence, unspecified, uncomplicated: Secondary | ICD-10-CM | POA: Diagnosis not present

## 2019-10-22 DIAGNOSIS — Z8673 Personal history of transient ischemic attack (TIA), and cerebral infarction without residual deficits: Secondary | ICD-10-CM | POA: Diagnosis not present

## 2019-10-22 DIAGNOSIS — B370 Candidal stomatitis: Secondary | ICD-10-CM | POA: Diagnosis not present

## 2019-10-22 DIAGNOSIS — Z79899 Other long term (current) drug therapy: Secondary | ICD-10-CM | POA: Diagnosis not present

## 2019-10-22 DIAGNOSIS — B379 Candidiasis, unspecified: Secondary | ICD-10-CM | POA: Diagnosis not present

## 2019-10-22 DIAGNOSIS — Z9049 Acquired absence of other specified parts of digestive tract: Secondary | ICD-10-CM | POA: Diagnosis not present

## 2019-10-25 DIAGNOSIS — F332 Major depressive disorder, recurrent severe without psychotic features: Secondary | ICD-10-CM | POA: Diagnosis not present

## 2019-10-26 DIAGNOSIS — Z8673 Personal history of transient ischemic attack (TIA), and cerebral infarction without residual deficits: Secondary | ICD-10-CM | POA: Diagnosis not present

## 2019-10-26 DIAGNOSIS — Z21 Asymptomatic human immunodeficiency virus [HIV] infection status: Secondary | ICD-10-CM | POA: Diagnosis not present

## 2019-10-26 DIAGNOSIS — B379 Candidiasis, unspecified: Secondary | ICD-10-CM | POA: Diagnosis not present

## 2019-10-26 DIAGNOSIS — B370 Candidal stomatitis: Secondary | ICD-10-CM | POA: Diagnosis not present

## 2019-10-26 DIAGNOSIS — Z9049 Acquired absence of other specified parts of digestive tract: Secondary | ICD-10-CM | POA: Diagnosis not present

## 2019-10-26 DIAGNOSIS — F17200 Nicotine dependence, unspecified, uncomplicated: Secondary | ICD-10-CM | POA: Diagnosis not present

## 2019-10-26 DIAGNOSIS — Z79899 Other long term (current) drug therapy: Secondary | ICD-10-CM | POA: Diagnosis not present

## 2019-10-27 DIAGNOSIS — F332 Major depressive disorder, recurrent severe without psychotic features: Secondary | ICD-10-CM | POA: Diagnosis not present

## 2019-10-28 DIAGNOSIS — F332 Major depressive disorder, recurrent severe without psychotic features: Secondary | ICD-10-CM | POA: Diagnosis not present

## 2019-10-29 DIAGNOSIS — F332 Major depressive disorder, recurrent severe without psychotic features: Secondary | ICD-10-CM | POA: Diagnosis not present

## 2019-11-19 DIAGNOSIS — Z760 Encounter for issue of repeat prescription: Secondary | ICD-10-CM | POA: Diagnosis not present

## 2019-11-19 DIAGNOSIS — B370 Candidal stomatitis: Secondary | ICD-10-CM | POA: Diagnosis not present

## 2019-12-15 DIAGNOSIS — Z79899 Other long term (current) drug therapy: Secondary | ICD-10-CM | POA: Diagnosis not present

## 2019-12-15 DIAGNOSIS — N400 Enlarged prostate without lower urinary tract symptoms: Secondary | ICD-10-CM | POA: Diagnosis present

## 2019-12-15 DIAGNOSIS — F29 Unspecified psychosis not due to a substance or known physiological condition: Secondary | ICD-10-CM | POA: Diagnosis not present

## 2019-12-15 DIAGNOSIS — D72819 Decreased white blood cell count, unspecified: Secondary | ICD-10-CM | POA: Diagnosis present

## 2019-12-15 DIAGNOSIS — T446X2A Poisoning by alpha-adrenoreceptor antagonists, intentional self-harm, initial encounter: Secondary | ICD-10-CM | POA: Diagnosis present

## 2019-12-15 DIAGNOSIS — Z21 Asymptomatic human immunodeficiency virus [HIV] infection status: Secondary | ICD-10-CM | POA: Diagnosis present

## 2019-12-15 DIAGNOSIS — T43222A Poisoning by selective serotonin reuptake inhibitors, intentional self-harm, initial encounter: Secondary | ICD-10-CM | POA: Diagnosis present

## 2019-12-15 DIAGNOSIS — R45851 Suicidal ideations: Secondary | ICD-10-CM | POA: Diagnosis present

## 2019-12-15 DIAGNOSIS — T43592A Poisoning by other antipsychotics and neuroleptics, intentional self-harm, initial encounter: Secondary | ICD-10-CM | POA: Diagnosis not present

## 2019-12-15 DIAGNOSIS — G4700 Insomnia, unspecified: Secondary | ICD-10-CM | POA: Diagnosis present

## 2019-12-15 DIAGNOSIS — Z20822 Contact with and (suspected) exposure to covid-19: Secondary | ICD-10-CM | POA: Diagnosis present

## 2019-12-15 DIAGNOSIS — F332 Major depressive disorder, recurrent severe without psychotic features: Secondary | ICD-10-CM | POA: Diagnosis present

## 2019-12-15 DIAGNOSIS — D696 Thrombocytopenia, unspecified: Secondary | ICD-10-CM | POA: Diagnosis not present

## 2019-12-20 DIAGNOSIS — F332 Major depressive disorder, recurrent severe without psychotic features: Secondary | ICD-10-CM | POA: Diagnosis not present

## 2019-12-21 DIAGNOSIS — F332 Major depressive disorder, recurrent severe without psychotic features: Secondary | ICD-10-CM | POA: Diagnosis not present

## 2019-12-21 DIAGNOSIS — F411 Generalized anxiety disorder: Secondary | ICD-10-CM | POA: Diagnosis not present

## 2019-12-21 DIAGNOSIS — N390 Urinary tract infection, site not specified: Secondary | ICD-10-CM | POA: Diagnosis not present

## 2019-12-21 DIAGNOSIS — F4310 Post-traumatic stress disorder, unspecified: Secondary | ICD-10-CM | POA: Diagnosis not present

## 2019-12-22 DIAGNOSIS — F332 Major depressive disorder, recurrent severe without psychotic features: Secondary | ICD-10-CM | POA: Diagnosis not present

## 2019-12-23 DIAGNOSIS — F332 Major depressive disorder, recurrent severe without psychotic features: Secondary | ICD-10-CM | POA: Diagnosis not present

## 2019-12-24 DIAGNOSIS — F332 Major depressive disorder, recurrent severe without psychotic features: Secondary | ICD-10-CM | POA: Diagnosis not present

## 2019-12-27 DIAGNOSIS — F332 Major depressive disorder, recurrent severe without psychotic features: Secondary | ICD-10-CM | POA: Diagnosis not present

## 2019-12-28 DIAGNOSIS — F332 Major depressive disorder, recurrent severe without psychotic features: Secondary | ICD-10-CM | POA: Diagnosis not present

## 2019-12-29 DIAGNOSIS — F332 Major depressive disorder, recurrent severe without psychotic features: Secondary | ICD-10-CM | POA: Diagnosis not present

## 2019-12-30 DIAGNOSIS — F332 Major depressive disorder, recurrent severe without psychotic features: Secondary | ICD-10-CM | POA: Diagnosis not present

## 2019-12-31 DIAGNOSIS — F332 Major depressive disorder, recurrent severe without psychotic features: Secondary | ICD-10-CM | POA: Diagnosis not present

## 2020-01-03 DIAGNOSIS — F332 Major depressive disorder, recurrent severe without psychotic features: Secondary | ICD-10-CM | POA: Diagnosis not present

## 2020-01-04 DIAGNOSIS — F332 Major depressive disorder, recurrent severe without psychotic features: Secondary | ICD-10-CM | POA: Diagnosis not present

## 2020-01-05 DIAGNOSIS — F332 Major depressive disorder, recurrent severe without psychotic features: Secondary | ICD-10-CM | POA: Diagnosis not present

## 2020-01-06 DIAGNOSIS — F332 Major depressive disorder, recurrent severe without psychotic features: Secondary | ICD-10-CM | POA: Diagnosis not present

## 2020-01-07 DIAGNOSIS — F332 Major depressive disorder, recurrent severe without psychotic features: Secondary | ICD-10-CM | POA: Diagnosis not present

## 2020-01-10 DIAGNOSIS — F332 Major depressive disorder, recurrent severe without psychotic features: Secondary | ICD-10-CM | POA: Diagnosis not present

## 2020-01-11 DIAGNOSIS — F332 Major depressive disorder, recurrent severe without psychotic features: Secondary | ICD-10-CM | POA: Diagnosis not present

## 2020-01-12 DIAGNOSIS — F332 Major depressive disorder, recurrent severe without psychotic features: Secondary | ICD-10-CM | POA: Diagnosis not present

## 2020-01-13 DIAGNOSIS — F332 Major depressive disorder, recurrent severe without psychotic features: Secondary | ICD-10-CM | POA: Diagnosis not present

## 2020-01-14 DIAGNOSIS — F332 Major depressive disorder, recurrent severe without psychotic features: Secondary | ICD-10-CM | POA: Diagnosis not present

## 2020-01-20 DIAGNOSIS — Z9889 Other specified postprocedural states: Secondary | ICD-10-CM | POA: Diagnosis not present

## 2020-01-20 DIAGNOSIS — N401 Enlarged prostate with lower urinary tract symptoms: Secondary | ICD-10-CM | POA: Diagnosis not present

## 2020-01-20 DIAGNOSIS — R338 Other retention of urine: Secondary | ICD-10-CM | POA: Diagnosis not present

## 2020-01-20 DIAGNOSIS — F17200 Nicotine dependence, unspecified, uncomplicated: Secondary | ICD-10-CM | POA: Diagnosis not present

## 2020-01-20 DIAGNOSIS — N390 Urinary tract infection, site not specified: Secondary | ICD-10-CM | POA: Diagnosis not present

## 2020-01-20 DIAGNOSIS — B20 Human immunodeficiency virus [HIV] disease: Secondary | ICD-10-CM | POA: Diagnosis not present

## 2020-01-20 DIAGNOSIS — I639 Cerebral infarction, unspecified: Secondary | ICD-10-CM | POA: Diagnosis not present

## 2020-01-20 DIAGNOSIS — Z87438 Personal history of other diseases of male genital organs: Secondary | ICD-10-CM | POA: Diagnosis not present

## 2020-01-20 DIAGNOSIS — D696 Thrombocytopenia, unspecified: Secondary | ICD-10-CM | POA: Diagnosis not present

## 2020-01-20 DIAGNOSIS — R339 Retention of urine, unspecified: Secondary | ICD-10-CM | POA: Diagnosis not present

## 2020-01-26 DIAGNOSIS — R339 Retention of urine, unspecified: Secondary | ICD-10-CM | POA: Diagnosis not present

## 2020-01-26 DIAGNOSIS — Z79899 Other long term (current) drug therapy: Secondary | ICD-10-CM | POA: Diagnosis not present

## 2020-01-26 DIAGNOSIS — Z87440 Personal history of urinary (tract) infections: Secondary | ICD-10-CM | POA: Diagnosis not present

## 2020-10-18 DEATH — deceased
